# Patient Record
Sex: Male | Born: 1950 | Race: White | Hispanic: No | Marital: Married | Smoking: Former smoker
Health system: Southern US, Community
[De-identification: ages and names within clinical notes are randomized; demographics above are authoritative.]

## PROBLEM LIST (undated history)

## (undated) DIAGNOSIS — I10 Essential (primary) hypertension: Secondary | ICD-10-CM

## (undated) DIAGNOSIS — I6529 Occlusion and stenosis of unspecified carotid artery: Secondary | ICD-10-CM

## (undated) DIAGNOSIS — C801 Malignant (primary) neoplasm, unspecified: Secondary | ICD-10-CM

## (undated) DIAGNOSIS — Z940 Kidney transplant status: Secondary | ICD-10-CM

## (undated) HISTORY — PX: AV FISTULA PLACEMENT: SHX1204

## (undated) HISTORY — DX: Occlusion and stenosis of unspecified carotid artery: I65.29

## (undated) HISTORY — DX: Malignant (primary) neoplasm, unspecified: C80.1

## (undated) HISTORY — PX: ROTATOR CUFF REPAIR: SHX139

---

## 2009-10-15 ENCOUNTER — Inpatient Hospital Stay (HOSPITAL_COMMUNITY): Admission: EM | Admit: 2009-10-15 | Discharge: 2009-10-23 | Payer: Self-pay | Admitting: Internal Medicine

## 2009-10-16 ENCOUNTER — Ambulatory Visit: Payer: Self-pay | Admitting: Vascular Surgery

## 2009-10-20 ENCOUNTER — Encounter (INDEPENDENT_AMBULATORY_CARE_PROVIDER_SITE_OTHER): Payer: Self-pay | Admitting: Nephrology

## 2009-10-22 ENCOUNTER — Ambulatory Visit: Payer: Self-pay | Admitting: Surgery

## 2009-12-03 ENCOUNTER — Ambulatory Visit: Payer: Self-pay | Admitting: Vascular Surgery

## 2010-10-31 LAB — GLUCOSE, CAPILLARY: Glucose-Capillary: 114 mg/dL — ABNORMAL HIGH (ref 70–99)

## 2010-10-31 LAB — CBC
HCT: 26.5 % — ABNORMAL LOW (ref 39.0–52.0)
HCT: 27.1 % — ABNORMAL LOW (ref 39.0–52.0)
HCT: 28.4 % — ABNORMAL LOW (ref 39.0–52.0)
HCT: 29.5 % — ABNORMAL LOW (ref 39.0–52.0)
Hemoglobin: 9 g/dL — ABNORMAL LOW (ref 13.0–17.0)
MCHC: 33 g/dL (ref 30.0–36.0)
MCHC: 33.2 g/dL (ref 30.0–36.0)
MCHC: 34 g/dL (ref 30.0–36.0)
MCV: 92.2 fL (ref 78.0–100.0)
MCV: 92.6 fL (ref 78.0–100.0)
MCV: 93.6 fL (ref 78.0–100.0)
MCV: 93.8 fL (ref 78.0–100.0)
MCV: 94.2 fL (ref 78.0–100.0)
Platelets: 107 10*3/uL — ABNORMAL LOW (ref 150–400)
Platelets: 144 10*3/uL — ABNORMAL LOW (ref 150–400)
Platelets: 94 10*3/uL — ABNORMAL LOW (ref 150–400)
Platelets: 98 10*3/uL — ABNORMAL LOW (ref 150–400)
RBC: 3.14 MIL/uL — ABNORMAL LOW (ref 4.22–5.81)
RDW: 14.6 % (ref 11.5–15.5)
RDW: 14.8 % (ref 11.5–15.5)
RDW: 15 % (ref 11.5–15.5)
RDW: 15.2 % (ref 11.5–15.5)
WBC: 6.1 10*3/uL (ref 4.0–10.5)
WBC: 6.2 10*3/uL (ref 4.0–10.5)
WBC: 7 10*3/uL (ref 4.0–10.5)
WBC: 7.8 10*3/uL (ref 4.0–10.5)

## 2010-10-31 LAB — PROTEIN ELECTROPH W RFLX QUANT IMMUNOGLOBULINS
Alpha-1-Globulin: 6.2 % — ABNORMAL HIGH (ref 2.9–4.9)
Gamma Globulin: 11.3 % (ref 11.1–18.8)
M-Spike, %: NOT DETECTED g/dL
Total Protein ELP: 5.4 g/dL — ABNORMAL LOW (ref 6.0–8.3)

## 2010-10-31 LAB — BASIC METABOLIC PANEL
BUN: 40 mg/dL — ABNORMAL HIGH (ref 6–23)
BUN: 93 mg/dL — ABNORMAL HIGH (ref 6–23)
CO2: 16 mEq/L — ABNORMAL LOW (ref 19–32)
Calcium: 7.6 mg/dL — ABNORMAL LOW (ref 8.4–10.5)
Calcium: 9.7 mg/dL (ref 8.4–10.5)
Chloride: 111 mEq/L (ref 96–112)
Creatinine, Ser: 9.19 mg/dL — ABNORMAL HIGH (ref 0.4–1.5)
GFR calc Af Amer: 7 mL/min — ABNORMAL LOW (ref >60)
GFR calc non Af Amer: 6 mL/min — ABNORMAL LOW (ref >60)
GFR calc non Af Amer: 9 mL/min — ABNORMAL LOW (ref >60)
Glucose, Bld: 109 mg/dL — ABNORMAL HIGH (ref 70–99)
Glucose, Bld: 97 mg/dL (ref 70–99)
Potassium: 4.1 mEq/L (ref 3.5–5.1)
Potassium: 4.4 mEq/L (ref 3.5–5.1)

## 2010-10-31 LAB — TSH: TSH: 2.055 u[IU]/mL (ref 0.350–4.500)

## 2010-10-31 LAB — URINALYSIS, ROUTINE W REFLEX MICROSCOPIC
Nitrite: NEGATIVE
Specific Gravity, Urine: 1.014 (ref 1.005–1.030)
Urobilinogen, UA: 1 mg/dL (ref 0.0–1.0)

## 2010-10-31 LAB — IRON AND TIBC
Iron: 65 ug/dL (ref 42–135)
Saturation Ratios: 31 % (ref 20–55)
TIBC: 210 ug/dL — ABNORMAL LOW (ref 215–435)
UIBC: 145 ug/dL

## 2010-10-31 LAB — RENAL FUNCTION PANEL
Albumin: 3.2 g/dL — ABNORMAL LOW (ref 3.5–5.2)
BUN: 60 mg/dL — ABNORMAL HIGH (ref 6–23)
Calcium: 9.4 mg/dL (ref 8.4–10.5)
Chloride: 104 mEq/L (ref 96–112)
Creatinine, Ser: 6.29 mg/dL — ABNORMAL HIGH (ref 0.4–1.5)
Creatinine, Ser: 6.76 mg/dL — ABNORMAL HIGH (ref 0.4–1.5)
Creatinine, Ser: 7.43 mg/dL — ABNORMAL HIGH (ref 0.4–1.5)
GFR calc Af Amer: 10 mL/min — ABNORMAL LOW (ref >60)
GFR calc Af Amer: 11 mL/min — ABNORMAL LOW (ref >60)
GFR calc non Af Amer: 9 mL/min — ABNORMAL LOW (ref >60)
Glucose, Bld: 106 mg/dL — ABNORMAL HIGH (ref 70–99)
Glucose, Bld: 191 mg/dL — ABNORMAL HIGH (ref 70–99)
Phosphorus: 6.1 mg/dL — ABNORMAL HIGH (ref 2.3–4.6)
Phosphorus: 6.5 mg/dL — ABNORMAL HIGH (ref 2.3–4.6)
Potassium: 3.9 mEq/L (ref 3.5–5.1)
Potassium: 4.5 mEq/L (ref 3.5–5.1)

## 2010-10-31 LAB — UIFE/LIGHT CHAINS/TP QN, 24-HR UR
Beta, Urine: DETECTED — AB
Free Kappa/Lambda Ratio: 3.14 ratio (ref 0.46–4.00)
Free Lambda Lt Chains,Ur: 4.27 mg/dL — ABNORMAL HIGH (ref 0.08–1.01)
Time: 24 hours
Total Protein, Urine: 26.9 mg/dL

## 2010-10-31 LAB — COMPREHENSIVE METABOLIC PANEL
AST: 33 U/L (ref 0–37)
Albumin: 3.3 g/dL — ABNORMAL LOW (ref 3.5–5.2)
Alkaline Phosphatase: 80 U/L (ref 39–117)
BUN: 94 mg/dL — ABNORMAL HIGH (ref 6–23)
CO2: 17 mEq/L — ABNORMAL LOW (ref 19–32)
Calcium: 9 mg/dL (ref 8.4–10.5)
Chloride: 102 mEq/L (ref 96–112)
Chloride: 113 mEq/L — ABNORMAL HIGH (ref 96–112)
Creatinine, Ser: 6.93 mg/dL — ABNORMAL HIGH (ref 0.4–1.5)
GFR calc Af Amer: 10 mL/min — ABNORMAL LOW (ref >60)
GFR calc non Af Amer: 7 mL/min — ABNORMAL LOW (ref >60)
Potassium: 4.3 mEq/L (ref 3.5–5.1)
Sodium: 139 mEq/L (ref 135–145)
Total Bilirubin: 0.5 mg/dL (ref 0.3–1.2)
Total Protein: 5.7 g/dL — ABNORMAL LOW (ref 6.0–8.3)

## 2010-10-31 LAB — RETICULOCYTES
RBC.: 3.27 MIL/uL — ABNORMAL LOW (ref 4.22–5.81)
Retic Ct Pct: 0.5 % (ref 0.4–3.1)

## 2010-10-31 LAB — HEPARIN INDUCED THROMBOCYTOPENIA PNL
Heparin Induced Plt Ab: NEGATIVE
Patient O.D.: 0.288
Serotonin Release: NEGATIVE

## 2010-10-31 LAB — MAGNESIUM: Magnesium: 1.4 mg/dL — ABNORMAL LOW (ref 1.5–2.5)

## 2010-10-31 LAB — IGG, IGA, IGM: IgG (Immunoglobin G), Serum: 607 mg/dL — ABNORMAL LOW (ref 694–1618)

## 2010-10-31 LAB — URINE MICROSCOPIC-ADD ON

## 2010-10-31 LAB — DIFFERENTIAL
Lymphocytes Relative: 15 % (ref 12–46)
Lymphs Abs: 0.9 10*3/uL (ref 0.7–4.0)
Monocytes Relative: 5 % (ref 3–12)
Neutro Abs: 4.6 10*3/uL (ref 1.7–7.7)
Neutrophils Relative %: 78 % — ABNORMAL HIGH (ref 43–77)

## 2010-10-31 LAB — PTH, INTACT AND CALCIUM: Calcium, Total (PTH): 8.2 mg/dL — ABNORMAL LOW (ref 8.4–10.5)

## 2010-10-31 LAB — PHOSPHORUS: Phosphorus: 4.9 mg/dL — ABNORMAL HIGH (ref 2.3–4.6)

## 2010-10-31 LAB — PROTIME-INR: INR: 1.22 (ref 0.00–1.49)

## 2010-12-06 HISTORY — PX: KIDNEY TRANSPLANT: SHX239

## 2010-12-20 NOTE — Assessment & Plan Note (Signed)
OFFICE VISIT   Randall Archer, Randall Archer  DOB:  [birth year]                                       12/03/2009  CHART#:[id number]   Followup, left amino fistula.   HISTORY OF PRESENT ILLNESS:  Patient is a 60 year old gentleman who had  his left Cimino fistula placed by Dr. Scot Dock in March of this year.  He  dialyzes on Tuesday, Thursdays, and Saturdays through a palindrome  catheter.  He complains of no steal symptoms.  He has full use of his  hand.  He has no pain, numbness, or tingling in the hand.   PHYSICAL EXAMINATION:  Vital signs:  Heart rate 78, sats are 100%.  Respiratory rate is 10.  In the left upper extremity, the wounds are  healing well.  There is a 2+ radial pulse.  There is a good thrill and  bruit, and the vein is very prominent in the left upper extremity.   ASSESSMENT/PLAN:  Assessment is functioning arteriovenous fistula, which  is maturing well.  Plan is to have him follow up as needed.  If there  are any issues, the fistula may be used 3 months post surgery.   Wray Kearns, PA-C   Rosetta Posner, M.D.  Electronically Signed   RR/MEDQ  D:  12/03/2009  T:  12/03/2009  Job:  [id number]

## 2011-09-15 ENCOUNTER — Emergency Department (HOSPITAL_COMMUNITY): Payer: Medicare Other

## 2011-09-15 ENCOUNTER — Encounter (HOSPITAL_COMMUNITY): Payer: Self-pay | Admitting: Emergency Medicine

## 2011-09-15 ENCOUNTER — Emergency Department (HOSPITAL_COMMUNITY)
Admission: EM | Admit: 2011-09-15 | Discharge: 2011-09-16 | Disposition: A | Discharge status: Home / Self Care | Payer: Medicare Other | Attending: Emergency Medicine | Admitting: Emergency Medicine

## 2011-09-15 ENCOUNTER — Other Ambulatory Visit (INDEPENDENT_AMBULATORY_CARE_PROVIDER_SITE_OTHER): Payer: Medicare Other | Admitting: *Deleted

## 2011-09-15 DIAGNOSIS — Z940 Kidney transplant status: Secondary | ICD-10-CM | POA: Insufficient documentation

## 2011-09-15 DIAGNOSIS — R0989 Other specified symptoms and signs involving the circulatory and respiratory systems: Secondary | ICD-10-CM

## 2011-09-15 DIAGNOSIS — R911 Solitary pulmonary nodule: Secondary | ICD-10-CM

## 2011-09-15 DIAGNOSIS — I10 Essential (primary) hypertension: Secondary | ICD-10-CM | POA: Insufficient documentation

## 2011-09-15 DIAGNOSIS — Z7982 Long term (current) use of aspirin: Secondary | ICD-10-CM | POA: Insufficient documentation

## 2011-09-15 DIAGNOSIS — Z79899 Other long term (current) drug therapy: Secondary | ICD-10-CM | POA: Insufficient documentation

## 2011-09-15 DIAGNOSIS — I658 Occlusion and stenosis of other precerebral arteries: Secondary | ICD-10-CM | POA: Insufficient documentation

## 2011-09-15 DIAGNOSIS — I6529 Occlusion and stenosis of unspecified carotid artery: Secondary | ICD-10-CM

## 2011-09-15 HISTORY — DX: Kidney transplant status: Z94.0

## 2011-09-15 HISTORY — DX: Essential (primary) hypertension: I10

## 2011-09-15 LAB — BASIC METABOLIC PANEL
Calcium: 10.2 mg/dL (ref 8.4–10.5)
Creatinine, Ser: 1.08 mg/dL (ref 0.50–1.35)
GFR calc Af Amer: 84 mL/min — ABNORMAL LOW (ref >90)
GFR calc non Af Amer: 72 mL/min — ABNORMAL LOW (ref >90)

## 2011-09-15 LAB — CBC
MCV: 91.4 fL (ref 78.0–100.0)
Platelets: 144 10*3/uL — ABNORMAL LOW (ref 150–400)
RDW: 12.9 % (ref 11.5–15.5)
WBC: 3.3 10*3/uL — ABNORMAL LOW (ref 4.0–10.5)

## 2011-09-15 MED ORDER — IOHEXOL 350 MG/ML SOLN
70.0000 mL | Freq: Once | INTRAVENOUS | Status: AC | PRN
  Administered 2011-09-15: 70 mL via INTRAVENOUS

## 2011-09-15 MED ORDER — SODIUM CHLORIDE 0.9 % IV BOLUS (SEPSIS)
1000.0000 mL | Freq: Once | INTRAVENOUS | Status: AC
  Administered 2011-09-15: 1000 mL via INTRAVENOUS

## 2011-09-15 MED ORDER — IOHEXOL 350 MG/ML SOLN
50.0000 mL | Freq: Once | INTRAVENOUS | Status: AC | PRN
  Administered 2011-09-15: 50 mL via INTRAVENOUS

## 2011-09-15 MED ORDER — SODIUM CHLORIDE 0.9 % IV SOLN
Freq: Once | INTRAVENOUS | Status: AC
  Administered 2011-09-15: 22:00:00 via INTRAVENOUS

## 2011-09-15 NOTE — ED Notes (Signed)
Pt sts [facility] for eval for R common artery dissection; pt sts had ultrasound but needed to be cleared

## 2011-09-15 NOTE — ED Provider Notes (Signed)
History     CSN: [id number]  Arrival date & time 09/15/11  1615   First MD Initiated Contact with Patient 09/15/11 1703      Chief Complaint  Patient presents with  . Circulatory Problem    sent for eval for possible disection    (Consider location/radiation/quality/duration/timing/severity/associated sxs/prior treatment) HPI Comments: 61yo CM sent from [facility] for evaluation of possible innominate or Right common carotid artery dissection. Patient had ultrasound in clinic today concerning for possible dissection. He is asymptomatic.   Patient is a 61 y.o. male presenting with neurologic complaint. The history is provided by the patient.  Neurologic Problem Primary symptoms do not include headaches, syncope, loss of consciousness, altered mental status, seizures, dizziness, visual change, paresthesias, focal weakness, loss of sensation, speech change, memory loss, fever, nausea or vomiting. Episode onset: unknown. Progression since onset: asymptomatic. The neurological symptoms are focal. Context: No recent trauma.  Additional symptoms do not include neck stiffness, weakness, pain, leg pain, photophobia, hearing loss, tinnitus or vertigo. Medical issues also include hypertension. Medical issues do not include seizures or cerebral vascular accident. Associated medical issues comments: renal transplant. Workup history includes carotid ultrasound.    Past Medical History  Diagnosis Date  . Hx of kidney transplant   . Hypertension     History reviewed. No pertinent past surgical history.  History reviewed. No pertinent family history.  History  Substance Use Topics  . Smoking status: Never Smoker   . Smokeless tobacco: Not on file  . Alcohol Use: No      Review of Systems  Constitutional: Negative for fever, chills, activity change, appetite change and fatigue.  HENT: Negative for hearing loss, congestion, sore throat, rhinorrhea, neck pain, neck  stiffness and tinnitus.   Eyes: Negative for photophobia, redness and visual disturbance.  Respiratory: Negative for cough, shortness of breath and wheezing.   Cardiovascular: Negative for chest pain, palpitations, leg swelling and syncope.  Gastrointestinal: Negative for nausea, vomiting, abdominal pain, diarrhea, constipation and blood in stool.  Genitourinary: Negative for dysuria, urgency, hematuria and flank pain.  Musculoskeletal: Negative for back pain.  Skin: Negative for rash and wound.  Neurological: Negative for dizziness, vertigo, speech change, focal weakness, seizures, loss of consciousness, facial asymmetry, speech difficulty, weakness, light-headedness, numbness, headaches and paresthesias.  Psychiatric/Behavioral: Negative for memory loss, confusion and altered mental status.  All other systems reviewed and are negative.    Allergies  Review of patient's allergies indicates no known allergies.  Home Medications   Current Outpatient Rx  Name Route Sig Dispense Refill  . ALLOPURINOL 100 MG PO TABS Oral Take 100 mg by mouth 2 (two) times daily.    Marland Kitchen AMLODIPINE BESYLATE 5 MG PO TABS Oral Take 5 mg by mouth every evening.    . ASPIRIN EC 81 MG PO TBEC Oral Take 81 mg by mouth daily.    Marland Kitchen CLONAZEPAM 1 MG PO TABS Oral Take 1 mg by mouth every evening.    Marland Kitchen DOCUSATE SODIUM 100 MG PO CAPS Oral Take 100 mg by mouth every other day.    Marland Kitchen MYCOPHENOLATE SODIUM 180 MG PO TBEC Oral Take 360 mg by mouth 2 (two) times daily.    Marland Kitchen OMEPRAZOLE 20 MG PO CPDR Oral Take 20 mg by mouth every evening.    . K PHOS MONO-SOD PHOS DI & MONO 155-852-130 MG PO TABS Oral Take 2 tablets by mouth 2 (two) times daily.    . SODIUM BICARBONATE 650 MG  PO TABS Oral Take 1,300 mg by mouth 2 (two) times daily.    . SULFAMETHOXAZOLE-TRIMETHOPRIM 400-80 MG PO TABS Oral Take 1 tablet by mouth every Monday, Wednesday, and Friday.    Marland Kitchen TACROLIMUS 1 MG PO CAPS Oral Take 2 mg by mouth 2 (two) times daily.    Marland Kitchen  TAMSULOSIN HCL 0.4 MG PO CAPS Oral Take 0.4 mg by mouth daily after breakfast.    . VARDENAFIL HCL 20 MG PO TABS Oral Take 20 mg by mouth daily as needed. For E.D.      BP 150/88  Pulse 77  Temp(Src) 97.9 F (36.6 C) (Oral)  Resp 16  SpO2 97%  Physical Exam  Nursing note and vitals reviewed. Constitutional: He is oriented to person, place, and time. He appears well-developed and well-nourished.  Non-toxic appearance. No distress.  HENT:  Head: Normocephalic and atraumatic.  Mouth/Throat: Oropharynx is clear and moist.  Eyes: Conjunctivae and EOM are normal. Pupils are equal, round, and reactive to light. No scleral icterus.  Neck: Normal range of motion and full passive range of motion without pain. Neck supple. No JVD present.       + grade 2/5 carotid artery bruit on right side.   Cardiovascular: Normal rate, regular rhythm, normal heart sounds and intact distal pulses.   No murmur heard. Pulmonary/Chest: Effort normal and breath sounds normal. No respiratory distress. He has no wheezes. He has no rales.  Abdominal: Soft. Bowel sounds are normal. He exhibits no distension. There is no tenderness. There is no rebound and no guarding.  Musculoskeletal: Normal range of motion.  Neurological: He is alert and oriented to person, place, and time. He has normal strength. No cranial nerve deficit or sensory deficit. GCS eye subscore is 4. GCS verbal subscore is 5. GCS motor subscore is 6.  Skin: Skin is warm and dry. No rash noted. He is not diaphoretic.  Psychiatric: He has a normal mood and affect.    ED Course  Procedures (including critical care time)  Labs Reviewed  BASIC METABOLIC PANEL - Abnormal; Notable for the following:    Glucose, Bld 133 (*)    GFR calc non Af Amer 72 (*)    GFR calc Af Amer 84 (*)    All other components within normal limits  CBC - Abnormal; Notable for the following:    WBC 3.3 (*)    RBC 4.18 (*)    HCT 38.2 (*)    Platelets 144 (*)    All other  components within normal limits   Ct Angio Neck W/cm &/or Wo/cm  09/15/2011  *RADIOLOGY REPORT*  Clinical Data:  Question right carotid dissection on carotid Doppler performed today.  CT ANGIOGRAPHY NECK  Technique:  Multidetector CT imaging of the neck was performed using the standard protocol during bolus administration of intravenous contrast.  Multiplanar CT image reconstructions including MIPs were obtained to evaluate the vascular anatomy. Carotid stenosis measurements (when applicable) are obtained utilizing NASCET criteria, using the distal internal carotid diameter as the denominator.  Contrast: 40mL OMNIPAQUE IOHEXOL 350 MG/ML IV SOLN  Comparison:   None.  Findings:  Lung apices are clear.  Negative for mass or adenopathy in the neck.  Cervical disc degeneration and prominent spondylosis C5-6 and C6-7.  No acute bony abnormality.  Right carotid:  Atherosclerotic calcification in the innominate and right common carotid artery.  Negative for dissection or significant stenosis.  Atherosclerotic calcification in the carotid bulb on the right without significant stenosis.  Negative for dissection of the internal carotid artery.  Left carotid:  Atherosclerotic calcified plaque in the common carotid and in the carotid bulb without significant stenosis. Negative for carotid dissection or stenosis on the left.  Vertebral arteries:  Both vertebral arteries are patent.  Mild atherosclerotic calcification in the proximal right vertebral artery and the distal right vertebral artery.   Review of the MIP images confirms the above findings.  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 Negative for carotid dissection.  No significant carotid or vertebral artery stenosis.  Original Report Authenticated By: Truett Perna, M.D.   Ct Angio Chest W/cm &/or Wo Cm  09/15/2011  *RADIOLOGY REPORT*  Clinical Data: Question of right common carotid/innominate artery dissection on ultrasound.   Ultrasound is not available.  CT ANGIOGRAPHY CHEST  Technique:  Multidetector CT imaging of the chest using the standard protocol during bolus administration of intravenous contrast. Multiplanar reconstructed images including MIPs were obtained and reviewed to evaluate the vascular anatomy.  Contrast: 66mL OMNIPAQUE IOHEXOL 350 MG/ML IV SOLN  Comparison: None.  Findings: There is atherosclerotic calcification of the aorta and great vessels.  However, there is no evidence for dissection.  The heart size is normal.  The pulmonary arteries are also well opacified by the contrast bolus.  There is no evidence for acute pulmonary embolus.  Note is made of right hilar lymph node which measures 2.5 x 1.6 cm.  No pulmonary nodules, pleural effusions, or infiltrates.  There is mild, mosaic appearance of the lung parenchyma, consistent with mild edema. The visualized portion of the thyroid gland has a normal appearance.  In the anterior chest wall, there is a circumscribed, 3.5 x 2.0 cm subcutaneous mass.  This appears low attenuation and may represent an intradermal inclusion cyst or other benign lesion.  Correlation with physical exam is recommended.  Mass is superficial to the sternum and is likely visible on physical exam.  Visualized osseous structures have a normal appearance.  IMPRESSION:  1. 1.  No evidence for aortic or innominate artery dissection. 2.  Atherosclerotic calcification without aneurysm. 3.  Right hilar lymph node, nonspecific in appearance.  Correlation is recommended with previous exams if available.  Otherwise, I would suggest follow-up chest CT in 6 months if the patient has a history of smoking.  Otherwise, follow-up of the chest is suggested in 12 months if the patient has no history of surgery. 4.  Anterior chest wall lesion, felt to be benign.  Correlation with physical exam is recommended.  Original Report Authenticated By: Glenice Bow, M.D.     1. Right carotid bruit       MDM    61yo CM sent from [facility] for evaluation of possible innominate or Right common carotid artery dissection. Patient had ultrasound in clinic today concerning for possible dissection. He is asymptomatic. Pt had ultrasound due to his renal doctor finding a carotid bruit on physical exam. He is neurologically intact. Getting basic labs to eval renal function. Will contact [facility] to ensure correct study ordered.   Vascular surgeon contacted requesting CTA neck and chest.   Pt creatinine 1. Discussed with nephrologist Dr. Idolina Primer on call for [facility] who recs persuing CTA as requested by vasc surgeon. Knowing risk of potential nephrotoxicity to transplanted kidney. Pt has already received 1L saline bolus prior to CT. Renal recs additional bolus after CTA. Pt aware of risks and agrees to CTA.   At 9:45 PM discussed CT  results with Dr. Owens Shark. Radiologist having difficulty dictating at this time. CTA neck and chest without evidence of dissection or carotids or innominate artery or other great vessels. Small pulm nodule right perihilar region. Pt getting 2nd liter IVF.   Pt getting 3rd liter IVF 200cc/h.   At 11:54 PM Pt has had hydration s/p CT. CTs neg for dissection. Pt has athero and pulm nodule. Results reviewed with pt. Will d/c.   Merideth Abbey, MD 09/16/11 0030

## 2011-09-15 NOTE — ED Notes (Signed)
Sent here per [facility] for eval of carotid artery dissection. Sent to [facility] per dr [employer]. Pt denies symptoms.

## 2011-09-16 NOTE — ED Provider Notes (Signed)
I saw and evaluated the patient, reviewed the resident's note and I agree with the findings and plan.   .Face to face Exam:  General:  Awake HEENT:  Atraumatic Resp:  Normal effort Abd:  Nondistended Neuro:No focal weakness Lymph: No adenopathy   Dot Lanes, MD 09/16/11 [phone]

## 2011-09-25 NOTE — Procedures (Unsigned)
CAROTID DUPLEX EXAM  INDICATION:  Right carotid bruit.  HISTORY: Diabetes:  No. Cardiac:  No. Hypertension:  Yes. Smoking:  No. Previous Surgery:  No. CV History:  Currently asymptomatic. Amaurosis Fugax No, Paresthesias No, Hemiparesis No                                      RIGHT             LEFT Brachial systolic pressure:                           AVF Brachial Doppler waveforms: Vertebral direction of flow:        Antegrade         Antegrade DUPLEX VELOCITIES (cm/sec) CCA peak systolic                   P=276, M=71       71 ECA peak systolic                   61                93 ICA peak systolic                   66                59 ICA end diastolic                   27                23 PLAQUE MORPHOLOGY:                  Mixed             Mixed PLAQUE AMOUNT:                      Moderate          Minimal PLAQUE LOCATION:                    Proximal CCA, bifurcation, ICA      Bifurcation, ICA  IMPRESSION: 1. 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 2. Right proximal common carotid artery stenosis with elevated     velocity of 276 cm/s. 3. Bilateral internal carotid artery velocity suggestive of a 1-39%     stenosis. 4. Dr. Bridgett Larsson was notified of these findings, who then consulted with     the patient and advised them to go to the ER.  ___________________________________________ Randall Palmer, MD  EM/MEDQ  D:  09/15/2011  T:  09/15/2011  Job:  [id number]

## 2011-09-28 ENCOUNTER — Other Ambulatory Visit: Payer: Self-pay | Admitting: *Deleted

## 2011-09-28 ENCOUNTER — Telehealth: Payer: Self-pay | Admitting: *Deleted

## 2011-09-28 DIAGNOSIS — I6529 Occlusion and stenosis of unspecified carotid artery: Secondary | ICD-10-CM

## 2011-09-28 NOTE — Telephone Encounter (Signed)
Dr. Oneida Alar reviewed patients carotid duplex and CTA done thru the ED on 09-15-11. Patient will followup with Dr. Bridgett Larsson as a new patient in 1 year. Will have protocol carotid duplex at that time. Per Dr. Oneida Alar, there was no carotid dissection and only minimal carotid stenosis. I called Shaquina at Dr. Jason Nest office and left this message on her voicemail.

## 2012-02-02 DIAGNOSIS — D849 Immunodeficiency, unspecified: Secondary | ICD-10-CM | POA: Insufficient documentation

## 2012-02-02 DIAGNOSIS — I10 Essential (primary) hypertension: Secondary | ICD-10-CM | POA: Insufficient documentation

## 2012-09-12 ENCOUNTER — Encounter: Payer: Self-pay | Admitting: Vascular Surgery

## 2012-09-13 ENCOUNTER — Encounter: Payer: Self-pay | Admitting: Vascular Surgery

## 2012-09-13 ENCOUNTER — Other Ambulatory Visit (INDEPENDENT_AMBULATORY_CARE_PROVIDER_SITE_OTHER): Payer: Medicare Other | Admitting: *Deleted

## 2012-09-13 ENCOUNTER — Ambulatory Visit (INDEPENDENT_AMBULATORY_CARE_PROVIDER_SITE_OTHER): Payer: Medicare Other | Admitting: Vascular Surgery

## 2012-09-13 VITALS — BP 139/81 | HR 67 | Wt 177.9 lb

## 2012-09-13 DIAGNOSIS — I771 Stricture of artery: Secondary | ICD-10-CM

## 2012-09-13 DIAGNOSIS — I6529 Occlusion and stenosis of unspecified carotid artery: Secondary | ICD-10-CM | POA: Insufficient documentation

## 2012-09-13 NOTE — Progress Notes (Signed)
[facility] [place]  New Carotid Patient  Referred by:  Raina Mina, MD [place], [place] [address]  Reason for referral: prior abnormal carotid study  History of Present Illness  Randall Archer is a 62 y.o. ([birth year]) male who presents with chief complaint: annual follow-up.  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  Subsequent CTA neck demonstated no significant carotid stenosis and extensive calcification of innominate artery.  Patient has no history of TIA or stroke symptom.  The patient has nevern had amaurosis fugax or monocular blindness.  The patient has nevern had facial drooping or hemiplegia.  The patient has never had receptive or expressive aphasia.   The patient's risks factors for carotid disease include: immunosuppression (s/p LRKT), HTN, former smoker  Past Medical History  Diagnosis Date  . Hx of kidney transplant   . Hypertension     Past Surgical History  Procedure Date  . Kidney transplant 12/2010    History   Social History  . Marital Status: Married    Spouse Name: N/A    Number of Children: N/A  . Years of Education: N/A   Occupational History  . Not on file.   Social History Main Topics  . Smoking status: Former Smoker    Types: Cigarettes    Quit date: 09/13/2008  . Smokeless tobacco: Never Used  . Alcohol Use: No  . Drug Use: No  . Sexually Active: Not on file   Other Topics Concern  . Not on file   Social History Narrative  . No narrative on file    History reviewed. No pertinent family history.  Current Outpatient Prescriptions on File Prior to Visit  Medication Sig Dispense Refill  . allopurinol (ZYLOPRIM) 100 MG tablet Take 100 mg by mouth 2 (two) times daily.      Marland Kitchen amLODipine (NORVASC) 5 MG tablet Take 5 mg by mouth every evening.      Marland Kitchen aspirin EC 81 MG tablet Take 81 mg by mouth daily.      .  clonazePAM (KLONOPIN) 1 MG tablet Take 1 mg by mouth every evening.      . docusate sodium (COLACE) 100 MG capsule Take 100 mg by mouth every other day.      . mycophenolate (MYFORTIC) 180 MG EC tablet Take 360 mg by mouth 2 (two) times daily.      Marland Kitchen omeprazole (PRILOSEC) 20 MG capsule Take 20 mg by mouth every evening.      . phosphorus (PHOSPHA 250 NEUTRAL) 155-852-130 MG tablet Take 2 tablets by mouth 2 (two) times daily.      . sodium bicarbonate 650 MG tablet Take 1,300 mg by mouth 2 (two) times daily.      Marland Kitchen sulfamethoxazole-trimethoprim (BACTRIM,SEPTRA) 400-80 MG per tablet Take 1 tablet by mouth every Monday, Wednesday, and Friday.      . tacrolimus (PROGRAF) 1 MG capsule Take 2 mg by mouth 2 (two) times daily.      . Tamsulosin HCl (FLOMAX) 0.4 MG CAPS Take 0.4 mg by mouth daily after breakfast.      . vardenafil (LEVITRA) 20 MG tablet Take 20 mg by mouth daily as needed. For E.D.        No Known Allergies  REVIEW OF SYSTEMS:  (Positives checked otherwise negative)  CARDIOVASCULAR:  [ ]  chest pain, [ ]  chest pressure, [ ]  palpitations, [ ]  shortness of breath  when laying flat, [ ]  shortness of breath with exertion,   [ ]  pain in feet when walking, [ ]  pain in feet when laying flat, [ ]  history of blood clot in veins (DVT), [ ]  history of phlebitis, [ ]  swelling in legs, [ ]  varicose veins  PULMONARY:  [ ]  productive cough, [ ]  asthma, [ ]  wheezing  NEUROLOGIC:  [ ]  weakness in arms or legs, [ ]  numbness in arms or legs, [ ]  difficulty speaking or slurred speech, [ ]  temporary loss of vision in one eye, [ ]  dizziness  HEMATOLOGIC:  [ ]  bleeding problems, [ ]  problems with blood clotting too easily  MUSCULOSKEL:  [ ]  joint pain, [ ]  joint swelling  GASTROINTEST:  [ ]   Vomiting blood, [ ]   Blood in stool     GENITOURINARY:  [ ]   Burning with urination, [ ]   Blood in urine  PSYCHIATRIC:  [ ]  history of major depression  INTEGUMENTARY:  [ ]  rashes, [ ]  ulcers  CONSTITUTIONAL:  [  ] fever, [ ]  chills  Physical Examination  Filed Vitals:   09/13/12 1402  BP: 139/81  Pulse: 67  Weight: 177 lb 14.4 oz (80.695 kg)  SpO2: 100%   There is no height on file to calculate BMI.  General: A&O x 3, WDWN  Head: [place]/AT  Ear/Nose/Throat: Hearing grossly intact, nares w/o erythema or drainage, oropharynx w/o Erythema/Exudate  Eyes: PERRLA, EOMI  Neck: Supple, no nuchal rigidity, no palpable LAD  Pulmonary: Sym exp, good air movt, CTAB, no rales, rhonchi, & wheezing  Cardiac: RRR, Nl S1, S2, no Murmurs, rubs or gallops  Vascular: Vessel Right Left  Radial Palpable Palpable  Ulnar Palpable Palpable  Brachial Palpable Palpable  Carotid Palpable, with bruit Palpable, with bruit  Aorta Not palpable N/A  Femoral Palpable Palpable  Popliteal Not palpable Not palpable  PT Palpable Palpable  DP Palpable Palpable   Gastrointestinal: soft, NTND, -G/R, - HSM, - masses, - CVAT B  Musculoskeletal: M/S 5/5 throughout , Extremities without ischemic changes , palpable thrill in L forearm  Neurologic: CN 2-12 intact , Pain and light touch intact in extremities , Motor exam as listed above  Psychiatric: Judgment intact, Mood & affect appropriate for pt's clinical situation  Dermatologic: See M/S exam for extremity exam, no rashes otherwise noted  Lymph : No Cervical, Axillary, or Inguinal lymphadenopathy   Non-Invasive Vascular Imaging  CAROTID DUPLEX (Date: 09/13/12):   R ICA stenosis: 1-39%  R VA: patent and antegrade/atypical  L ICA stenosis: 1-39%  L VA: patent and antegrade  R proximal SCA: 235 c/s  R CCA: 263 c/s (276 c/s)  Medical Decision Making  Randall Archer is a 62 y.o. male who presents with: no hemodynamic B ICA stenosis, R CCA, SCA, and innominate stenosis   Based on the patient's vascular studies and examination, I have offered the patient: annual carotid duplex to monitor the R CCA.  Patient is completely asx in regards to the innominate,  subclavian, and common carotid disease with stable CCA velocities for now.  I don't think there is any immediate intervention needed.  I discussed in depth with the patient the nature of atherosclerosis, and emphasized the importance of maximal medical management including strict control of blood pressure, blood glucose, and lipid levels, obtaining regular exercise, antiplatelet agents, and cessation of smoking.    The patient is aware that without maximal medical management the underlying atherosclerotic disease process will progress, limiting the  benefit of any interventions.  Thank you for allowing [place] to participate in this patient's care.  Adele Barthel, MD [facility] [place] Office: [phone] Pager: [phone]  09/13/2012, 3:37 PM

## 2012-09-17 ENCOUNTER — Other Ambulatory Visit: Payer: Self-pay | Admitting: *Deleted

## 2012-09-17 DIAGNOSIS — I6529 Occlusion and stenosis of unspecified carotid artery: Secondary | ICD-10-CM

## 2012-10-05 DIAGNOSIS — C801 Malignant (primary) neoplasm, unspecified: Secondary | ICD-10-CM

## 2012-10-05 HISTORY — DX: Malignant (primary) neoplasm, unspecified: C80.1

## 2013-03-14 ENCOUNTER — Other Ambulatory Visit: Payer: Medicare Other

## 2013-03-14 ENCOUNTER — Ambulatory Visit: Payer: Medicare Other | Admitting: Vascular Surgery

## 2013-03-27 ENCOUNTER — Encounter: Payer: Self-pay | Admitting: Family

## 2013-03-28 ENCOUNTER — Other Ambulatory Visit (INDEPENDENT_AMBULATORY_CARE_PROVIDER_SITE_OTHER): Payer: Medicare Other | Admitting: *Deleted

## 2013-03-28 ENCOUNTER — Ambulatory Visit (INDEPENDENT_AMBULATORY_CARE_PROVIDER_SITE_OTHER): Payer: Medicare Other | Admitting: Family

## 2013-03-28 ENCOUNTER — Telehealth: Payer: Self-pay | Admitting: Vascular Surgery

## 2013-03-28 ENCOUNTER — Encounter: Payer: Self-pay | Admitting: Family

## 2013-03-28 DIAGNOSIS — I6529 Occlusion and stenosis of unspecified carotid artery: Secondary | ICD-10-CM

## 2013-03-28 DIAGNOSIS — Z01810 Encounter for preprocedural cardiovascular examination: Secondary | ICD-10-CM

## 2013-03-28 DIAGNOSIS — I771 Stricture of artery: Secondary | ICD-10-CM

## 2013-03-28 NOTE — Progress Notes (Signed)
Established Carotid Patient  History of Present Illness  Randall Archer is a 62 y.o. male who has known carotid stenosis. 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 Subsequent CTA neck demonstated no significant carotid stenosis and extensive calcification of innominate artery. Patient has no history of TIA or stroke symptom. The patient has nevern had amaurosis fugax or monocular blindness. The patient has nevern had facial drooping or hemiplegia. The patient has never had receptive or expressive aphasia. The patient's risks factors for carotid disease include: immunosuppression (s/p LRKT), HTN, former smoker.   Patient has not had previous Neither CEA.  Patient has Negative history of TIA or stroke symptom.  The patient denies amaurosis fugax or monocular blindness.  The patient  denies facial drooping.  Pt. denies hemiplegia.  The patient denies receptive or expressive aphasia.  Pt. denies weakness.  New Medical or Surgical History other than a skin lesion removed on left hand by dermatologist, not cancer per patient.  Pt Diabetic: No Pt smoker: No  Pt meds include: Statin : Yes ASA: Yes Other anticoagulants/antiplatelets: none   Past Medical History  Diagnosis Date  . Hx of kidney transplant   . Hypertension   . 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 Carotid artery occlusion     Social History History  Substance Use Topics  . Smoking status: Former Smoker    Types: Cigarettes    Quit date: 09/13/2008  . Smokeless tobacco: Never Used  . Alcohol Use: No    Family History Family History  Problem Relation Age of Onset  . Hypertension Mother   . Hypertension Father   . Cancer Father   . Heart disease Brother     Heart Disease before age 40  . Heart attack Brother     Surgical History Past Surgical History  Procedure Laterality Date  . Kidney transplant  12/2010    No  Known Allergies  Current Outpatient Prescriptions  Medication Sig Dispense Refill  . allopurinol (ZYLOPRIM) 100 MG tablet Take 100 mg by mouth 2 (two) times daily.      Marland Kitchen amLODipine (NORVASC) 5 MG tablet Take 5 mg by mouth every evening.      Marland Kitchen aspirin EC 81 MG tablet Take 81 mg by mouth daily.      Marland Kitchen BOOSTRIX 5-2.5-18.5 injection       . clonazePAM (KLONOPIN) 1 MG tablet Take 1 mg by mouth every evening.      . docusate sodium (COLACE) 100 MG capsule Take 100 mg by mouth every other day.      . mycophenolate (MYFORTIC) 180 MG EC tablet Take 360 mg by mouth 2 (two) times daily.      Marland Kitchen omeprazole (PRILOSEC) 20 MG capsule Take 20 mg by mouth every evening.      . phosphorus (PHOSPHA 250 NEUTRAL) 155-852-130 MG tablet Take 2 tablets by mouth 2 (two) times daily.      . pravastatin (PRAVACHOL) 20 MG tablet       . sodium bicarbonate 650 MG tablet Take 1,300 mg by mouth 2 (two) times daily.      Marland Kitchen sulfamethoxazole-trimethoprim (BACTRIM,SEPTRA) 400-80 MG per tablet Take 1 tablet by mouth every Monday, Wednesday, and Friday.      . tacrolimus (PROGRAF) 1 MG capsule Take 2 mg by mouth 2 (two) times daily.      . Tamsulosin HCl (FLOMAX) 0.4 MG CAPS Take 0.4 mg  by mouth daily after breakfast.      . vardenafil (LEVITRA) 20 MG tablet Take 20 mg by mouth daily as needed. For E.D.       No current facility-administered medications for this visit.    Review of Systems : [x]  Positive   [ ]  Denies  General:[ ]  Weight loss,  [ ]  Weight gain, [ ]  Loss of appetite, [ ]  Fever, [ ]  chills  Neurologic: [ ]  Dizziness, [ ]  Blackouts, [ ]  Headaches, [ ]  Seizure [ ]  Stroke, [ ]  "Mini stroke", [ ]  Slurred speech, [ ]  Temporary blindness;  [ ] weakness,  Ear/Nose/Throat: [ ]  Change in hearing, [ ]  Nose bleeds, [ ]  Hoarseness  Vascular:[ ]  Pain in legs with walking, [ ]  Pain in feet while lying flat , [ ]   Non-healing ulcer, [ ]  Blood clot in vein,    Pulmonary: [ ]  Home oxygen, [ ]   Productive cough, [ ]   Bronchitis, [ ]  Coughing up blood,  [ ]  Asthma, [ ]  Wheezing  Musculoskeletal:  [ ]  Arthritis, [ ]  Joint pain, [ ]  low back pain  Cardiac: [ ]  Chest pain, [ ]  Shortness of breath when lying flat, [ ]  Shortness of breath with exertion, [ ]  Palpitations, [ ]  Heart murmur, [ ]   Atrial fibrillation  Hematologic:[X ] Easy Bruising, [ ]  Anemia; [ ]  Hepatitis  Psychiatric: [ ]   Depression, [ ]  Anxiety   Gastrointestinal: [ ]  Black stool, [ ]  Blood in stool, [ ]  Peptic ulcer disease,  [ ]  Gastroesophageal Reflux, [ ]  Trouble swallowing, [ ]  Diarrhea, [ ]  Constipation  Urinary: [ ]  chronic Kidney disease, [ ]  on HD, [ ]  Burning with urination, [ ]  Frequent urination, [ ]  Difficulty urinating; POSITIVE FOR S/P KIDNEY TRANSPLANT, MAY, 2012; prior to that was on hemodialysis for 13 years.  Skin: [ ]  Rashes, [ ]  Wounds    Physical Examination  Filed Vitals:   03/28/13 1004  BP:   Pulse:   Temp: 97.3 F (36.3 C)  Resp:     General: WDWN male in NAD GAIT: normal Eyes: PERRLA Pulmonary:  CTAB, Negative  Rales, Negative rhonchi, & Negative wheezing, positive for diminished air movement right lower posterior fields.  Cardiac: regular Rhythm ,  Negative Murmurs,   Vascular:      Vessel Right Left  Radial Palpable Palpable  Brachial Palpable Palpable  Carotid audible, without bruit audiable, without bruit  Aorta Not palpable N/A  Popliteal not palpable Not palpable  PT palpable palpable  DP Palpable Palpable    Gastrointestinal: soft, nontender, BS WNL, no r/g,  negative masses; note right flank well healed surgical scar.  Musculoskeletal: Negative muscle atrophy/wasting. M/S 5/5 throughout, Extremities without ischemic changes.  Neurologic: A&O X 3; Appropriate Affect ; SENSATION ;normal; MOTOR FUNCTION: normal 5/5 strength in all tested muscle groups Speech is normal. CN 2-12 intact, Pain and light touch intact in extremities, Motor exam as listed above.   Non-Invasive Vascular  Imaging CAROTID DUPLEX 03/28/2013   Right ICA <40% stenosis Left ICA <40% stenosis R CCA stenosis 288/28 c/s  These findings are Worse from previous exam   Assessment: Randall Archer is a 62 y.o. 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  Repeat CTA Neck was recommended to the patient to reinterrogate the innominate artery given worsening in velocities.  The patient will  follow up Dr. Bridgett Larsson in 2 weeks with that study.  I discussed in depth with the patient the nature of atherosclerosis, and emphasized the importance of maximal medical management including strict control of blood pressure, blood glucose, and lipid levels, obtaining regular exercise, and cessation of smoking.  The patient is aware that without maximal medical management the underlying atherosclerotic disease process will progress, limiting the benefit of any interventions.  Pt was given information regarding stroke symptoms and prevention.  Thank you for allowing [place] to participate in this patient's care.  Clemon Chambers, RN, MSN, FNP-C [facility] [place] Office: [phone]  Clinic Physician: Bridgett Larsson  03/28/2013 10:03 AM

## 2013-03-28 NOTE — Telephone Encounter (Signed)
Dr Bridgett Larsson has ordered a CTA Neck to evaluate Inominate Artery Stenosis.  Patient was concerned about having IV contrast due to having a kidney transplant. According to his wife, he requires IV fluids before and after having IV contrast.  I spoke with [place] at [facility]. She, in turn, talked with Dr Justin Mend. Per Dr Justin Mend- we may proceed as normal. There is no special requirements now that the patient is s/p transplant. IV fluids are no longer needed prior to contrast.  I advised this to the patient, but also encouraged that he call Dr Justin Mend if he was at all uneasy about our conversation. dpm

## 2013-04-10 ENCOUNTER — Encounter: Payer: Self-pay | Admitting: Vascular Surgery

## 2013-04-11 ENCOUNTER — Ambulatory Visit: Payer: Medicare Other | Admitting: Vascular Surgery

## 2013-04-11 ENCOUNTER — Ambulatory Visit
Admission: RE | Admit: 2013-04-11 | Discharge: 2013-04-11 | Disposition: A | Discharge status: Home / Self Care | Payer: Medicare Other | Source: Ambulatory Visit | Attending: Vascular Surgery | Admitting: Vascular Surgery

## 2013-04-11 DIAGNOSIS — I771 Stricture of artery: Secondary | ICD-10-CM

## 2013-04-11 DIAGNOSIS — Z01810 Encounter for preprocedural cardiovascular examination: Secondary | ICD-10-CM

## 2013-04-11 MED ORDER — IOHEXOL 350 MG/ML SOLN
50.0000 mL | Freq: Once | INTRAVENOUS | Status: AC | PRN
  Administered 2013-04-11: 50 mL via INTRAVENOUS

## 2013-04-18 ENCOUNTER — Encounter: Payer: Self-pay | Admitting: Vascular Surgery

## 2013-04-18 ENCOUNTER — Ambulatory Visit (INDEPENDENT_AMBULATORY_CARE_PROVIDER_SITE_OTHER): Payer: Medicare Other | Admitting: Vascular Surgery

## 2013-04-18 DIAGNOSIS — I771 Stricture of artery: Secondary | ICD-10-CM

## 2013-04-18 DIAGNOSIS — I6529 Occlusion and stenosis of unspecified carotid artery: Secondary | ICD-10-CM

## 2013-04-18 NOTE — Progress Notes (Signed)
[facility] [place]  Established Carotid Patient  History of Present Illness  Randall Archer is a 62 y.o. (04-06-1951) male who presents with chief complaint: follow-up on CTA.  Previous carotid studies demonstrated: RICA A999333 stenosis, LICA A999333 stenosis in setting of known innominate artery stenosis.  Patient has no history of TIA or stroke symptom.  The patient has never had amaurosis fugax or monocular blindness.  The patient has never had facial drooping or hemiplegia.  The patient has never had receptive or expressive aphasia.    The patient's PMH, PSH, SH, FamHx, Med, and Allergies are unchanged from 03/28/13.  On ROS today: no complications from LRKT, no CVA or TIA sx  Physical Examination  Filed Vitals:   04/18/13 1116  BP: 155/79  Pulse: 66  Height: 5\' 11"  (1.803 m)  Weight: 179 lb 11.2 oz (81.511 kg)  SpO2: 100%   Body mass index is 25.07 kg/(m^2).  General: A&O x 3, WD, WN  Eyes: PERRLA, EOMI  Neck: Supple, no nuchal rigidity, no palpable LAD  Pulmonary: Sym exp, good air movt, CTAB, no rales, rhonchi, & wheezing  Cardiac: RRR, Nl S1, S2, no Murmurs, rubs or gallops  Vascular: Vessel Right Left  Radial Palpable Palpable  Brachial Palpable Palpable  Carotid Palpable, without bruit Palpable, without bruit  Aorta Not palpable N/A  Femoral Palpable Palpable  Popliteal Not palpable Not palpable  PT Palpable Palpable  DP Palpable Palpable   Gastrointestinal: soft, NTND, -G/R, - HSM, - masses, - CVAT B  Musculoskeletal: M/S 5/5 throughout , Extremities without ischemic changes   Neurologic: CN 2-12 intact , Pain and light touch intact in extremities , Motor exam as listed above  CTA Neck (04/11/13)   No apparent change since the previous study.   Atherosclerotic calcification of the aortic arch and the brachiocephalic vessel origins from the arch.   Extensive atherosclerotic calcification of the innominate artery origin results in  luminal narrowing to about 5 mm. This probably represents a 50-70% stenosis.   Detail is limited because of low contrast opacity and physiologic motion in the chest.  Atherosclerotic calcification of both carotid bifurcation regions but without measurable stenosis relative to the diameter is of the more distal cervical internal carotid arteries.   Estimated 50% percent stenosis of the right vertebral artery origin and of the right vertebral artery at the foramen magnum level.   Medical Decision Making  Dracen Fluharty Trayer is a 62 y.o. 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  If CCA PSV increases or drops significantly, this would be indicative of possible disease progression.  I discussed in depth with the patient the nature of atherosclerosis, and emphasized the importance of maximal medical management including strict control of blood pressure, blood glucose, and lipid levels, antiplatelet agents, obtaining regular exercise, and cessation of smoking.    The patient is aware that without maximal medical management the underlying atherosclerotic disease process will progress, limiting the benefit of any interventions.  I discussed with the patient the accelerated atherosclerosis observed in transplant patient and the need to tightly manage his underlying disease processes to avoid progression of innominate disease. The patient is currently on a statin: Pravastatin. The patient is currently on an anti-platelet: ASA.  Thank you for allowing [place] to participate in this patient's  care.  Adele Barthel, MD [facility] [place] Office: [phone] Pager: [phone]  04/18/2013, 5:29 PM

## 2013-05-02 ENCOUNTER — Ambulatory Visit: Payer: Medicare Other | Admitting: Vascular Surgery

## 2013-09-08 ENCOUNTER — Other Ambulatory Visit: Payer: Self-pay | Admitting: Vascular Surgery

## 2013-09-08 DIAGNOSIS — I6529 Occlusion and stenosis of unspecified carotid artery: Secondary | ICD-10-CM

## 2013-10-16 ENCOUNTER — Encounter: Payer: Self-pay | Admitting: Vascular Surgery

## 2013-10-17 ENCOUNTER — Ambulatory Visit (HOSPITAL_COMMUNITY)
Admission: RE | Admit: 2013-10-17 | Discharge: 2013-10-17 | Disposition: A | Discharge status: Home / Self Care | Payer: Medicare Other | Source: Ambulatory Visit | Attending: Vascular Surgery | Admitting: Vascular Surgery

## 2013-10-17 ENCOUNTER — Encounter: Payer: Self-pay | Admitting: Vascular Surgery

## 2013-10-17 ENCOUNTER — Ambulatory Visit (INDEPENDENT_AMBULATORY_CARE_PROVIDER_SITE_OTHER): Payer: Medicare Other | Admitting: Vascular Surgery

## 2013-10-17 VITALS — BP 160/76 | HR 60 | Ht 71.0 in | Wt 180.5 lb

## 2013-10-17 DIAGNOSIS — I771 Stricture of artery: Secondary | ICD-10-CM

## 2013-10-17 DIAGNOSIS — I6529 Occlusion and stenosis of unspecified carotid artery: Secondary | ICD-10-CM | POA: Insufficient documentation

## 2013-10-17 NOTE — Progress Notes (Signed)
    Established Carotid Patient   History of Present Illness  Randall Archer is a 63 y.o. male  (12/19/1950) who presents with chief complaint: carotid follow-up. Previous carotid studies demonstrated: RICA A999333 stenosis, LICA A999333 stenosis in setting of known innominate artery stenosis. Patient has no history of TIA or stroke symptom. The patient has never had amaurosis fugax or monocular blindness. The patient has never had facial drooping or hemiplegia. The patient has never had receptive or expressive aphasia.   The patient's PMH, PSH, SH, FamHx, Med, and Allergies are unchanged from 04/18/14.   On ROS today: no complications from LRKT, no CVA or TIA sx   Physical Examination  Filed Vitals:   10/17/13 1022  BP: 160/76  Pulse: 60  Height: 5\' 11"  (1.803 m)  Weight: 180 lb 8 oz (81.874 kg)  SpO2: 100%   Body mass index is 25.19 kg/(m^2).  General: A&O x 3, WDWN   Eyes: PERRLA, EOMI   Neck: Supple, no nuchal rigidity, no palpable LAD   Pulmonary: Sym exp, good air movt, CTAB, no rales, rhonchi, & wheezing   Cardiac: RRR, Nl S1, S2, no Murmurs, rubs or gallops   Vascular:  Vessel  Right  Left   Radial  Palpable  Palpable   Brachial  Palpable  Palpable   Carotid  Palpable, without bruit  Palpable, without bruit   Aorta  Not palpable  N/A   Femoral  Palpable  Palpable   Popliteal  Not palpable  Not palpable   PT  Palpable  Palpable   DP  Palpable  Palpable    Gastrointestinal: soft, NTND, -G/R, - HSM, - masses, - CVAT B   Musculoskeletal: M/S 5/5 throughout , Extremities without ischemic changes   Neurologic: CN 2-12 intact , Pain and light touch intact in extremities , Motor exam as listed above   Non-Invasive Vascular Imaging  Carotid Duplex (Date: 10/17/2013):   R CCA stenosis > 50% (211/18 c/s, previous 288/28 c/s)  R ICA stenosis: <40%  R VA: patent with to-fro flow  L ICA stenosis: <40%  L VA: patent and antegrade  Medical Decision Making  Randall Archer is a 63 y.o. 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 If CCA PSV increases or drops significantly, this would be indicative of possible disease progression.  I discussed in depth with the patient the nature of atherosclerosis, and emphasized the importance of maximal medical management including strict control of blood pressure, blood glucose, and lipid levels, antiplatelet agents, obtaining regular exercise, and cessation of smoking.  The patient is aware that without maximal medical management the underlying atherosclerotic disease process will progress, limiting the benefit of any interventions.  I discussed with the patient the accelerated atherosclerosis observed in transplant patient and the need to tightly manage his underlying disease processes to avoid progression of innominate disease. The patient is currently on a statin: Pravastatin.  The patient is currently on an anti-platelet: ASA. Thank you for allowing [place] to participate in this patient's care.   Adele Barthel, MD [facility] [place] Office: [phone] Pager: [phone]  10/17/2013, 5:38 PM

## 2013-10-20 NOTE — Addendum Note (Signed)
Addended by: Mena Goes on: 10/20/2013 04:28 PM   Modules accepted: Orders

## 2014-04-23 ENCOUNTER — Encounter: Payer: Self-pay | Admitting: Vascular Surgery

## 2014-04-24 ENCOUNTER — Ambulatory Visit (INDEPENDENT_AMBULATORY_CARE_PROVIDER_SITE_OTHER): Payer: Medicare Other | Admitting: Vascular Surgery

## 2014-04-24 ENCOUNTER — Encounter: Payer: Self-pay | Admitting: Vascular Surgery

## 2014-04-24 ENCOUNTER — Ambulatory Visit (HOSPITAL_COMMUNITY)
Admission: RE | Admit: 2014-04-24 | Discharge: 2014-04-24 | Disposition: A | Discharge status: Home / Self Care | Payer: Medicare Other | Source: Ambulatory Visit | Attending: Vascular Surgery | Admitting: Vascular Surgery

## 2014-04-24 VITALS — BP 152/79 | HR 58 | Temp 97.2°F | Resp 16 | Ht 71.0 in | Wt 182.0 lb

## 2014-04-24 DIAGNOSIS — I6529 Occlusion and stenosis of unspecified carotid artery: Secondary | ICD-10-CM | POA: Diagnosis not present

## 2014-04-24 DIAGNOSIS — Z7982 Long term (current) use of aspirin: Secondary | ICD-10-CM | POA: Insufficient documentation

## 2014-04-24 DIAGNOSIS — I771 Stricture of artery: Secondary | ICD-10-CM

## 2014-04-24 NOTE — Addendum Note (Signed)
Addended by: Mena Goes on: 04/24/2014 11:02 AM   Modules accepted: Orders

## 2014-04-24 NOTE — Progress Notes (Signed)
   Established Carotid Patient  History of Present Illness  Randall Archer is a 63 y.o. male  (1951-08-06) s/p LRKT who presents with chief complaint: carotid follow-up. Previous carotid studies demonstrated: RICA A999333 stenosis, LICA A999333 stenosis in setting of known innominate artery stenosis. Patient has no history of TIA or stroke symptom. The patient has never had amaurosis fugax or monocular blindness. The patient has never had facial drooping or hemiplegia. The patient has never had receptive or expressive aphasia.   The patient's PMH, PSH, SH, FamHx, Med, and Allergies are unchanged from 10/17/13  On ROS today: no TIA or CVA, no PAD sx  Physical Examination  . Filed Vitals:   04/24/14 1021  BP: 152/79  Pulse: 58  Temp: 97.2 F (36.2 C)  TempSrc: Oral  Resp: 16  Height: 5\' 11"  (1.803 m)  Weight: 182 lb (82.555 kg)  SpO2: 99%    Body mass index is 25.4 kg/(m^2).   General: A&O x 3, WDWN   Eyes: PERRLA, EOMI   Neck: Supple, no nuchal rigidity, no palpable LAD   Pulmonary: Sym exp, good air movt, CTAB, no rales, rhonchi, & wheezing   Cardiac: RRR, Nl S1, S2, no Murmurs, rubs or gallops   Vascular:  Vessel  Right  Left   Radial  Palpable  Palpable   Brachial  Palpable  Palpable   Carotid  Palpable, with bruit  Palpable, without bruit   Aorta  Not palpable  N/A   Femoral  Palpable  Palpable   Popliteal  Not palpable  Not palpable   PT  Palpable  Palpable   DP  Palpable  Palpable    Gastrointestinal: soft, NTND, -G/R, - HSM, - masses, - CVAT B   Musculoskeletal: M/S 5/5 throughout , Extremities without ischemic changes   Neurologic: CN 2-12 intact , Pain and light touch intact in extremities , Motor exam as listed above   Non-Invasive Vascular Imaging  Carotid Duplex (Date: 04/24/2014):  R CCA stenosis > 50% (322/11 c/s <-211/18 c/s <- 288/28 c/s)  R ICA stenosis: <40%  R VA: patent with to-fro flow  L ICA stenosis: <40%  L VA: patent and antegrade  Medical  Decision Making  LEMARCUS Archer is a 63 y.o. male  who presents with: asx <50% stenoses B ICA stenosis , R innominate artery stenosis   Based on the patient's vascular studies and examination, I have offered the patient: q6 month carotid duplex. The CCA PSV isn't much different from 288 c/s previously, if PSV starts to escalate into high 300s, I would repeat the CTA. I discussed in depth with the patient the nature of atherosclerosis, and emphasized the importance of maximal medical management including strict control of blood pressure, blood glucose, and lipid levels, antiplatelet agents, obtaining regular exercise, and cessation of smoking.  The patient is aware that without maximal medical management the underlying atherosclerotic disease process will progress, limiting the benefit of any interventions.  I discussed with the patient the accelerated atherosclerosis observed in transplant patient and the need to tightly manage his underlying disease processes to avoid progression of innominate disease.  The patient is currently on a statin: Pravastatin.  The patient is currently on an anti-platelet: ASA.  Thank you for allowing [place] to participate in this patient's care.  Adele Barthel, MD [facility] [place] Office: [phone] Pager: [phone]  04/24/2014, 10:57 AM

## 2014-10-23 ENCOUNTER — Ambulatory Visit: Payer: Medicare Other | Admitting: Vascular Surgery

## 2014-10-23 ENCOUNTER — Other Ambulatory Visit (HOSPITAL_COMMUNITY): Payer: Medicare Other

## 2014-11-20 ENCOUNTER — Other Ambulatory Visit (HOSPITAL_COMMUNITY): Payer: Medicare Other

## 2014-11-20 ENCOUNTER — Ambulatory Visit: Payer: Medicare Other | Admitting: Vascular Surgery

## 2014-11-26 ENCOUNTER — Encounter: Payer: Self-pay | Admitting: Vascular Surgery

## 2014-11-27 ENCOUNTER — Ambulatory Visit (INDEPENDENT_AMBULATORY_CARE_PROVIDER_SITE_OTHER): Payer: Medicare Other | Admitting: Vascular Surgery

## 2014-11-27 ENCOUNTER — Encounter: Payer: Self-pay | Admitting: Vascular Surgery

## 2014-11-27 ENCOUNTER — Ambulatory Visit (HOSPITAL_COMMUNITY)
Admission: RE | Admit: 2014-11-27 | Discharge: 2014-11-27 | Disposition: A | Discharge status: Home / Self Care | Payer: Medicare Other | Source: Ambulatory Visit | Attending: Vascular Surgery | Admitting: Vascular Surgery

## 2014-11-27 VITALS — BP 135/70 | HR 54 | Ht 71.0 in | Wt 164.1 lb

## 2014-11-27 DIAGNOSIS — I771 Stricture of artery: Secondary | ICD-10-CM

## 2014-11-27 DIAGNOSIS — I6523 Occlusion and stenosis of bilateral carotid arteries: Secondary | ICD-10-CM

## 2014-11-27 NOTE — Progress Notes (Signed)
    Established Carotid Patient  History of Present Illness  Randall Archer is a 64 y.o. (11-02-1950) male who presents with chief complaint: no complaints.  This patient has known R SCA and innominate artery stenoses.  Previous carotid studies demonstrated: RICA A999333 stenosis, LICA A999333 stenosis.  Patient has no history of TIA or stroke symptom.  The patient has never had amaurosis fugax or monocular blindness.  The patient has never had facial drooping or hemiplegia.  The patient has never had receptive or expressive aphasia.  The patient denies any R arm claudication equivalent sx or vertebrobasilar sx  The patient's PMH, PSH, SH, FamHx, Med, and Allergies are unchanged from 04/24/14.  On ROS today: no dizziness, no arm pain or weakness  Physical Examination  Filed Vitals:   11/27/14 0953  BP: 135/70  Pulse: 54  Height: 5\' 11"  (1.803 m)  Weight: 164 lb 1.6 oz (74.435 kg)  SpO2: 100%   Body mass index is 22.9 kg/(m^2).  General: A&O x 3, WD, thin  Eyes: PERRLA, EOMI  Neck: Supple, no nuchal rigidity, no palpable LAD  Pulmonary: Sym exp, good air movt, CTAB, no rales, rhonchi, & wheezing  Cardiac: RRR, Nl S1, S2, no Murmurs, rubs or gallops  Vascular: Vessel Right Left  Radial Palpable Palpable  Brachial Palpable Palpable  Carotid Palpable, with bruit (R>L) Palpable, with bruit  Aorta Not palpable N/A  Femoral Palpable Palpable  Popliteal Not palpable Not palpable  PT Palpable Palpable  DP Palpable Palpable   Gastrointestinal: soft, NTND, -G/R, - HSM, - masses, - CVAT B  Musculoskeletal: M/S 5/5 throughout , Extremities without ischemic changes , strong L RC AVF fistula thrill  Neurologic: CN 2-12 intact , Pain and light touch intact in extremities , Motor exam as listed above  Non-Invasive Vascular Imaging  CAROTID DUPLEX (Date: 11/27/2014 ):   R ICA stenosis: <40%  R VA: patent and bidirectional  L ICA stenosis: <40%  L VA: patent and antegrade  R CCA:  297 c/s (322 c/s)  Medical Decision Making  Randall Archer is a 64 y.o. male who presents with: asx BICA stenosis <40%, asx R innominate and SCA stenoses, asx mild R subclavian steal    Pt is completely asx, so I doubt at advantage to aggressive mgmt of innominate stenosis or R SCA stenosis  Based on the patient's vascular studies and examination, I have offered the patient: annual B carotid duplex.  I discussed in depth with the patient the nature of atherosclerosis, and emphasized the importance of maximal medical management including strict control of blood pressure, blood glucose, and lipid levels, antiplatelet agents, obtaining regular exercise, and cessation of smoking.    The patient is aware that without maximal medical management the underlying atherosclerotic disease process will progress, limiting the benefit of any interventions. The patient is currently on a statin: Pravachol. The patient is currently on an anti-platelet: ASA.  Thank you for allowing [place] to participate in this patient's care.  Adele Barthel, MD [facility] [place] Office: [phone] Pager: [phone]  11/27/2014, 10:10 AM

## 2014-12-01 NOTE — Addendum Note (Signed)
Addended by: Mena Goes on: 12/01/2014 03:59 PM   Modules accepted: Orders

## 2015-10-31 DIAGNOSIS — N186 End stage renal disease: Secondary | ICD-10-CM | POA: Insufficient documentation

## 2015-10-31 DIAGNOSIS — E213 Hyperparathyroidism, unspecified: Secondary | ICD-10-CM | POA: Insufficient documentation

## 2015-10-31 DIAGNOSIS — Z992 Dependence on renal dialysis: Secondary | ICD-10-CM | POA: Insufficient documentation

## 2015-10-31 DIAGNOSIS — Z79899 Other long term (current) drug therapy: Secondary | ICD-10-CM | POA: Insufficient documentation

## 2015-10-31 DIAGNOSIS — M109 Gout, unspecified: Secondary | ICD-10-CM | POA: Insufficient documentation

## 2015-10-31 DIAGNOSIS — G4700 Insomnia, unspecified: Secondary | ICD-10-CM | POA: Insufficient documentation

## 2015-11-02 DIAGNOSIS — K219 Gastro-esophageal reflux disease without esophagitis: Secondary | ICD-10-CM | POA: Insufficient documentation

## 2015-11-04 DIAGNOSIS — E538 Deficiency of other specified B group vitamins: Secondary | ICD-10-CM | POA: Insufficient documentation

## 2015-12-03 ENCOUNTER — Encounter (HOSPITAL_COMMUNITY): Payer: Medicare Other

## 2015-12-03 ENCOUNTER — Ambulatory Visit: Payer: Medicare Other | Admitting: Vascular Surgery

## 2015-12-17 ENCOUNTER — Ambulatory Visit: Payer: Medicare Other | Admitting: Vascular Surgery

## 2016-02-18 ENCOUNTER — Ambulatory Visit: Payer: Medicare Other | Admitting: Vascular Surgery

## 2016-02-18 ENCOUNTER — Encounter (HOSPITAL_COMMUNITY): Payer: Medicare Other

## 2016-03-28 ENCOUNTER — Encounter: Payer: Self-pay | Admitting: Vascular Surgery

## 2016-03-30 NOTE — Progress Notes (Signed)
    Established Carotid Patient  History of Present Illness  Randall Archer is a 65 y.o. (1951/01/12) male  who presents with chief complaint: none.  This patient has known R SCA and innominate artery stenoses.  Previous carotid studies demonstrated: RICA A999333 stenosis, LICA A999333 stenosis.  Patient has no history of TIA or stroke symptom.  The patient has never had amaurosis fugax or monocular blindness.  The patient has never had facial drooping or hemiplegia.  The patient has never had receptive or expressive aphasia.  The patient denies any R arm claudication equivalent sx or vertebrobasilar sx  The patient's PMH, PSH, SH, FamHx, Med, and Allergies are unchanged from 11/27/14.  On ROS today: no arm sx, no CVA sx  Physical Examination  Vitals:   03/31/16 1440  BP: 138/80  Pulse: 67  Resp: 16  Temp: 97.1 F (36.2 C)  SpO2: 98%  Weight: 171 lb (77.6 kg)  Height: 5\' 11"  (1.803 m)   Body mass index is 23.85 kg/m.  General: A&O x 3, WD, thin  Eyes: PERRLA, EOMI  Neck: Supple, no nuchal rigidity, no palpable LAD  Pulmonary: Sym exp, good air movt, CTAB, no rales, rhonchi, & wheezing  Cardiac: RRR, Nl S1, S2, no Murmurs, rubs or gallops  Vascular: Vessel Right Left  Radial Palpable Palpable  Brachial Palpable Palpable  Carotid Palpable, with bruit (R>L) Palpable, with bruit  Aorta Not palpable N/A  Femoral Palpable Palpable  Popliteal Not palpable Not palpable  PT Palpable Palpable  DP Palpable Palpable   Gastrointestinal: soft, NTND, -G/R, - HSM, - masses, - CVAT B  Musculoskeletal: M/S 5/5 throughout , Extremities without ischemic changes , strong L RC AVF fistula thrill  Neurologic: CN grossly intact , Pain and light touch intact in extremities , Motor exam as listed above  Non-Invasive Vascular Imaging  CAROTID DUPLEX (Date: 03/31/2016 ):   R ICA stenosis: 1-39%  R VA: patent and bidirectional  L ICA stenosis: 1-39%  L VA: patent and  antegrade  R SCA: 302 c/s (297 c/s)   Medical Decision Making  Randall Archer is a 65 y.o. (19-Jul-1951) male who presents with: asx BICA stenosis <40%, asx R innominate and SCA stenoses, asx mild R subclavian steal    Pt is completely asx, so I doubt at advantage to aggressive mgmt of innominate stenosis or R SCA stenosis.  Additionally, radial pulses are symmetric, so there unlikely to be a hemodynamically significant stenosis in the right innominate and subclavian arteries.  Based on the patient's vascular studies and examination, I have offered the patient: annual B carotid duplex.  I discussed in depth with the patient the nature of atherosclerosis, and emphasized the importance of maximal medical management including strict control of blood pressure, blood glucose, and lipid levels, antiplatelet agents, obtaining regular exercise, and cessation of smoking.    The patient is aware that without maximal medical management the underlying atherosclerotic disease process will progress, limiting the benefit of any interventions.  The patient is currently on a statin: Pravachol.  The patient is currently on an anti-platelet: ASA.  Thank you for allowing [place] to participate in this patient's care.  Adele Barthel, MD [facility] Office: [phone] Pager: [phone]

## 2016-03-31 ENCOUNTER — Encounter: Payer: Self-pay | Admitting: Vascular Surgery

## 2016-03-31 ENCOUNTER — Ambulatory Visit (INDEPENDENT_AMBULATORY_CARE_PROVIDER_SITE_OTHER): Payer: Medicare Other | Admitting: Vascular Surgery

## 2016-03-31 ENCOUNTER — Ambulatory Visit (HOSPITAL_COMMUNITY)
Admission: RE | Admit: 2016-03-31 | Discharge: 2016-03-31 | Disposition: A | Discharge status: Home / Self Care | Payer: Medicare Other | Source: Ambulatory Visit | Attending: Vascular Surgery | Admitting: Vascular Surgery

## 2016-03-31 VITALS — BP 138/80 | HR 67 | Temp 97.1°F | Resp 16 | Ht 71.0 in | Wt 171.0 lb

## 2016-03-31 DIAGNOSIS — I6523 Occlusion and stenosis of bilateral carotid arteries: Secondary | ICD-10-CM

## 2016-03-31 DIAGNOSIS — I771 Stricture of artery: Secondary | ICD-10-CM | POA: Diagnosis present

## 2016-03-31 LAB — VAS US CAROTID
LCCADDIAS: 18 cm/s
LCCADSYS: 85 cm/s
LCCAPDIAS: 17 cm/s
LCCAPSYS: 86 cm/s
LEFT ECA DIAS: -12 cm/s
LICADSYS: -45 cm/s
LICAPSYS: -73 cm/s
Left ICA dist dias: -14 cm/s
Left ICA prox dias: -18 cm/s
RCCAPSYS: 121 cm/s
RIGHT CCA MID DIAS: -19 cm/s
RIGHT ECA DIAS: -12 cm/s
Right CCA prox dias: 0 cm/s
Right cca dist sys: -41 cm/s

## 2016-04-04 ENCOUNTER — Other Ambulatory Visit: Payer: Self-pay | Admitting: Vascular Surgery

## 2016-04-04 DIAGNOSIS — I771 Stricture of artery: Secondary | ICD-10-CM

## 2016-04-04 DIAGNOSIS — I6529 Occlusion and stenosis of unspecified carotid artery: Secondary | ICD-10-CM

## 2016-10-30 DIAGNOSIS — N401 Enlarged prostate with lower urinary tract symptoms: Secondary | ICD-10-CM | POA: Insufficient documentation

## 2016-10-30 DIAGNOSIS — R350 Frequency of micturition: Secondary | ICD-10-CM | POA: Insufficient documentation

## 2017-04-04 ENCOUNTER — Encounter: Payer: Self-pay | Admitting: Vascular Surgery

## 2017-04-10 NOTE — Progress Notes (Signed)
Established Intermittent Claudication   History of Present Illness   Randall Archer is a 66 y.o. (21-Apr-1951) male s/p Kidney transplant who presents with chief complaint: no complaints.  This patient has known R SCA and innominate artery stenoses. Patient's R arm sx are: none.  Patient has no sx in R arm and is able to complete all ADLs.  Previous carotid studies demonstrated: RICA <73% stenosis, LICA <41% stenosis. Patient has no history of TIA or stroke symptom. The patient has never had amaurosis fugax or monocular blindness. The patient has never had facial drooping or hemiplegia. The patient has never had receptive or expressive aphasia. The patient denies any R arm claudication equivalent sx or vertebrobasilar sx.  The patient's PMH, PSH, and SH, and FamHx are unchanged from 03/31/16.  Current Outpatient Prescriptions  Medication Sig Dispense Refill  . allopurinol (ZYLOPRIM) 100 MG tablet Take 100 mg by mouth 2 (two) times daily.    Marland Kitchen amLODipine (NORVASC) 10 MG tablet     . amLODipine (NORVASC) 5 MG tablet Take 5 mg by mouth every evening.    Marland Kitchen aspirin EC 81 MG tablet Take 81 mg by mouth daily.    Marland Kitchen BOOSTRIX 5-2.5-18.5 injection     . cephALEXin (KEFLEX) 500 MG capsule     . cinacalcet (SENSIPAR) 30 MG tablet Take 30 mg by mouth daily.    . clonazePAM (KLONOPIN) 1 MG tablet Take 1 mg by mouth every evening.    . docusate sodium (COLACE) 100 MG capsule Take 100 mg by mouth every other day.    . mycophenolate (MYFORTIC) 180 MG EC tablet Take 360 mg by mouth 2 (two) times daily.    Marland Kitchen omeprazole (PRILOSEC) 20 MG capsule Take 20 mg by mouth every evening.    . phosphorus (PHOSPHA 250 NEUTRAL) 155-852-130 MG tablet Take 2 tablets by mouth 2 (two) times daily.    . pravastatin (PRAVACHOL) 20 MG tablet 20 mg at bedtime.     . sodium bicarbonate 650 MG tablet Take 1,300 mg by mouth 2 (two) times daily.    Marland Kitchen sulfamethoxazole-trimethoprim (BACTRIM,SEPTRA) 400-80 MG per tablet Take 1  tablet by mouth every Monday, Wednesday, and Friday.    . tacrolimus (PROGRAF) 1 MG capsule Take 1 mg by mouth 2 (two) times daily. Two Tabs am and one Tab pm    . Tamsulosin HCl (FLOMAX) 0.4 MG CAPS Take 0.4 mg by mouth daily after breakfast.    . vardenafil (LEVITRA) 20 MG tablet Take 20 mg by mouth daily as needed. For E.D.     No current facility-administered medications for this visit.     On ROS today: no stroke sx, no right arm claudication sx.   Physical Examination   Vitals:   04/13/17 1608  BP: 124/69  Pulse: 61  SpO2: 100%  Weight: 177 lb 12.8 oz (80.6 kg)  Height: 5\' 11"  (1.803 m)   Body mass index is 24.8 kg/m.  General Alert, O x 3, WD, NAD  Pulmonary Sym exp, good B air movt, CTA B  Cardiac RRR, Nl S1, S2, no Murmurs, No rubs, No S3,S4  Vascular Vessel Right Left  Radial Palpable Palpable  Brachial Palpable Palpable  Carotid Palpable, No Bruit Palpable, No Bruit  Aorta Not palpable N/A  Femoral Palpable Palpable  Popliteal Not palpable Not palpable  PT Palpable Palpable  DP Palpable Palpable    Gastro- intestinal soft, non-distended, non-tender to palpation, No guarding or rebound, no HSM, no masses,  no CVAT B, No palpable prominent aortic pulse,    Musculo- skeletal M/S 5/5 throughout  , Extremities without ischemic changes  , No edema present, No visible varicosities , No Lipodermatosclerosis present  Neurologic Pain and light touch intact in extremities , Motor exam as listed above    Non-invasive Vascular Imaging     B Carotid Duplex (04/13/2017):   R ICA stenosis:  1-39%  R VA: bidirectional  L ICA stenosis:  1-39%  L VA: patent and antegrade  CCA proximal: 419 c/s (previously 121 c/s)   Medical Decision Making   Randall Archer is a 66 y.o. male who presents with:  asx BICA stenosis <40%, asx R innominate and SCA stenoses, asx mild R subclavian steal    The patient has significant jump in R proximal CCA PSV.  Unfortunately a  significant R CCA stenosis might result in artificially low PSV in R ICA, so further investigation with a CTA Neck is recommended.  Will get the CTA Neck scheduled in the next month  I discussed in depth with the patient the nature of atherosclerosis, and emphasized the importance of maximal medical management including strict control of blood pressure, blood glucose, and lipid levels, antiplatelet agents, obtaining regular exercise, and cessation of smoking.    The patient is aware that without maximal medical management the underlying atherosclerotic disease process will progress, limiting the benefit of any interventions. The patient is currently on a statin: Pravachol.  The patient is currently on an anti-platelet: ASA.  Thank you for allowing [place] to participate in this patient's care.   Adele Barthel, MD, FACS [facility] [place] [facility] Office: [phone] Pager: [phone]

## 2017-04-12 ENCOUNTER — Encounter (HOSPITAL_COMMUNITY): Payer: Self-pay | Admitting: *Deleted

## 2017-04-13 ENCOUNTER — Ambulatory Visit (HOSPITAL_COMMUNITY)
Admission: RE | Admit: 2017-04-13 | Discharge: 2017-04-13 | Disposition: A | Discharge status: Home / Self Care | Payer: Medicare Other | Source: Ambulatory Visit | Attending: Vascular Surgery | Admitting: Vascular Surgery

## 2017-04-13 ENCOUNTER — Encounter: Payer: Self-pay | Admitting: Vascular Surgery

## 2017-04-13 ENCOUNTER — Ambulatory Visit (INDEPENDENT_AMBULATORY_CARE_PROVIDER_SITE_OTHER): Payer: Medicare Other | Admitting: Vascular Surgery

## 2017-04-13 VITALS — BP 124/69 | HR 61 | Ht 71.0 in | Wt 177.8 lb

## 2017-04-13 DIAGNOSIS — I6529 Occlusion and stenosis of unspecified carotid artery: Secondary | ICD-10-CM

## 2017-04-13 DIAGNOSIS — I6523 Occlusion and stenosis of bilateral carotid arteries: Secondary | ICD-10-CM | POA: Insufficient documentation

## 2017-04-13 DIAGNOSIS — I771 Stricture of artery: Secondary | ICD-10-CM

## 2017-04-13 LAB — VAS US CAROTID
LCCADDIAS: -17 cm/s
LEFT ECA DIAS: -12 cm/s
LEFT VERTEBRAL DIAS: -11 cm/s
LICADDIAS: -19 cm/s
LICADSYS: -79 cm/s
Left CCA dist sys: -94 cm/s
Left CCA prox dias: 15 cm/s
Left CCA prox sys: 109 cm/s
Left ICA prox dias: -14 cm/s
Left ICA prox sys: -72 cm/s
RCCADSYS: -50 cm/s
RIGHT CCA MID DIAS: -21 cm/s
RIGHT ECA DIAS: -12 cm/s
RIGHT VERTEBRAL DIAS: -13 cm/s
Right CCA prox dias: 15 cm/s
Right CCA prox sys: 419 cm/s

## 2017-04-16 NOTE — Addendum Note (Signed)
Addended by: Lianne Cure A on: 04/16/2017 03:07 PM   Modules accepted: Orders

## 2017-05-09 NOTE — Progress Notes (Signed)
Established Intermittent Claudication   History of Present Illness   Randall Archer is a 66 y.o. (28-May-1951) male s/p Kidney transplant who presents with chief complaint: no complaints.  This patient has known R SCA and innominate artery stenoses. Patient's R arm sx are: none.  Patient has no sx in R arm and is able to complete all ADLs.  Previous carotid studies demonstrated: RICA <78% stenosis, LICA <93% stenosis. Patient has no history of TIA or stroke symptom. The patient has never had amaurosis fugax or monocular blindness. The patient has never had facial drooping or hemiplegia. The patient has never had receptive or expressive aphasia. The patient denies any R arm claudication equivalent sx or vertebrobasilar sx.  Pt returns from CTA Neck.    The patient's PMH, PSH, and SH, and FamHx are unchanged from 04/13/17.  Current Outpatient Prescriptions  Medication Sig Dispense Refill  . allopurinol (ZYLOPRIM) 100 MG tablet Take 100 mg by mouth 2 (two) times daily.    Marland Kitchen amLODipine (NORVASC) 5 MG tablet Take 5 mg by mouth every evening.    Marland Kitchen aspirin EC 81 MG tablet Take 81 mg by mouth daily.    . cinacalcet (SENSIPAR) 30 MG tablet Take 30 mg by mouth daily.    . clonazePAM (KLONOPIN) 1 MG tablet Take 1 mg by mouth every evening.    . docusate sodium (COLACE) 100 MG capsule Take 100 mg by mouth every other day.    . mycophenolate (MYFORTIC) 180 MG EC tablet Take 360 mg by mouth 2 (two) times daily.    Marland Kitchen omeprazole (PRILOSEC) 20 MG capsule Take 20 mg by mouth every evening.    . phosphorus (PHOSPHA 250 NEUTRAL) 155-852-130 MG tablet Take 2 tablets by mouth 2 (two) times daily.    . pravastatin (PRAVACHOL) 20 MG tablet 20 mg at bedtime.     . sodium bicarbonate 650 MG tablet Take 1,300 mg by mouth 2 (two) times daily.    Marland Kitchen sulfamethoxazole-trimethoprim (BACTRIM,SEPTRA) 400-80 MG per tablet Take 1 tablet by mouth every Monday, Wednesday, and Friday.    . tacrolimus (PROGRAF) 1  MG capsule Take 1 mg by mouth 2 (two) times daily. Two Tabs am and one Tab pm    . Tamsulosin HCl (FLOMAX) 0.4 MG CAPS Take 0.4 mg by mouth daily after breakfast.    . vardenafil (LEVITRA) 20 MG tablet Take 20 mg by mouth daily as needed. For E.D.     No current facility-administered medications for this visit.    On ROS today: no CVA or TIA sx, no R arm sx   Physical Examination   Vitals:   05/11/17 1507  BP: 137/73  Pulse: 62  Resp: 16  SpO2: 98%  Weight: 175 lb (79.4 kg)  Height: 5\' 11"  (1.803 m)   Body mass index is 24.41 kg/m.   General Alert, O x 3, WD, NAD  Pulmonary Sym exp, good B air movt, CTA B  Cardiac RRR, Nl S1, S2, no Murmurs, No rubs, No S3,S4  Vascular Vessel Right Left  Radial Palpable Palpable  Brachial Palpable Palpable  Carotid Palpable, No Bruit Palpable, No Bruit  Aorta Not palpable N/A  Femoral Palpable Palpable  Popliteal Not palpable Not palpable  PT Palpable Palpable  DP Palpable Palpable    Gastro- intestinal soft, non-distended, non-tender to palpation, No guarding or rebound, no HSM, no masses, no CVAT B, No palpable prominent aortic pulse,    Musculo- skeletal M/S 5/5 throughout  ,  Extremities without ischemic changes  , No edema present, No visible varicosities , No Lipodermatosclerosis present  Neurologic Pain and light touch intact in extremities , Motor exam as listed above    Radiology     Ct Angio Neck W Or Wo Contrast  Result Date: 05/11/2017 CLINICAL DATA:  Continued surveillance innominate stenosis diagnosed in 2014. EXAM: CT ANGIOGRAPHY NECK TECHNIQUE: Multidetector CT imaging of the neck was performed using the standard protocol during bolus administration of intravenous contrast. Multiplanar CT image reconstructions and MIPs were obtained to evaluate the vascular anatomy. Carotid stenosis measurements (when applicable) are obtained utilizing NASCET criteria, using the distal internal carotid diameter as the denominator.  CONTRAST:  Isovue 370, 50 mL. COMPARISON:  None. FINDINGS: Aortic arch: Transverse arch calcification. There is a focal calcification resulting in luminal narrowing at the origin of the innominate artery as noted previously. Measurement along the same dimension as 2014 yields a luminal diameter of 5.2 mm, unchanged. No soft plaque or dissection. There may be minimal poststenotic dilatation in the distal innominate. Non stenotic atheromatous change can be seen at the origin of the RIGHT subclavian, RIGHT common carotid, LEFT common carotid, and LEFT subclavian. In addition, the LEFT vertebral originates directly from the arch and does not show significant plaque. Right carotid system: Calcific atheromatous change at the bifurcation. No evidence of dissection, stenosis (50% or greater) or occlusion. Left carotid system: Calcific atheromatous change at the bifurcation. No evidence of dissection, stenosis (50% or greater) or occlusion. Vertebral arteries: LEFT dominant. Calcified atheromatous change at the origin RIGHT vertebral, roughly 50%, stable. Skeleton: Spondylosis, most pronounced at C5-6 and C6-7. Dentition is absent. Other neck: No neck mass. Lung apices clear. Negative visualized intracranial compartment. Upper chest: No lung apex lesion is evident. IMPRESSION: Stable atherosclerotic changes of the transverse arch, great vessel origins, and carotid bifurcations. No significant progression of disease, with specific attention to the innominate origin, when compared 2014. Electronically Signed   By: Staci Righter M.D.   On: 05/11/2017 14:59   I agree with the CTA reading, pt has relatively focal innominate artery stenosis with residual 5 mm lumen.  Both carotid arteries have some limited calcific plaque but both lumens have limited disease.  Right SCA lumen also appears to be patent.   Medical Decision Making   Randall Archer is a 66 y.o. (1951-02-19) male who presents with:  asx BICA stenosis <40%,  asx R innominate and SCA stenoses, asx mild R subclavian steal    Based on the CTA, I am reassured of the status of the R SCA, R innominate, and R carotid artery.  I would repeat the B carotid duplex in 6 months.  If stable, pt would like to consider moving to annual surveillance.  I discussed in depth with the patient the nature of atherosclerosis, and emphasized the importance of maximal medical management including strict control of blood pressure, blood glucose, and lipid levels, antiplatelet agents, obtaining regular exercise, and cessation of smoking.    The patient is aware that without maximal medical management the underlying atherosclerotic disease process will progress, limiting the benefit of any interventions.  The patient is currently on a statin: Pravachol.   The patient is currently on an anti-platelet: ASA.  Thank you for allowing [place] to participate in this patient's care.   Adele Barthel, MD, FACS [facility] Office: [phone] Pager: [phone]

## 2017-05-10 DIAGNOSIS — R809 Proteinuria, unspecified: Secondary | ICD-10-CM | POA: Insufficient documentation

## 2017-05-10 HISTORY — PX: RENAL BIOPSY: SHX156

## 2017-05-11 ENCOUNTER — Encounter: Payer: Self-pay | Admitting: Vascular Surgery

## 2017-05-11 ENCOUNTER — Ambulatory Visit (INDEPENDENT_AMBULATORY_CARE_PROVIDER_SITE_OTHER): Payer: Medicare Other | Admitting: Vascular Surgery

## 2017-05-11 ENCOUNTER — Ambulatory Visit
Admission: RE | Admit: 2017-05-11 | Discharge: 2017-05-11 | Disposition: A | Discharge status: Home / Self Care | Payer: Medicare Other | Source: Ambulatory Visit | Attending: Vascular Surgery | Admitting: Vascular Surgery

## 2017-05-11 VITALS — BP 137/73 | HR 62 | Resp 16 | Ht 71.0 in | Wt 175.0 lb

## 2017-05-11 DIAGNOSIS — I6523 Occlusion and stenosis of bilateral carotid arteries: Secondary | ICD-10-CM | POA: Diagnosis not present

## 2017-05-11 DIAGNOSIS — I771 Stricture of artery: Secondary | ICD-10-CM

## 2017-05-11 MED ORDER — IOPAMIDOL (ISOVUE-370) INJECTION 76%
75.0000 mL | Freq: Once | INTRAVENOUS | Status: AC | PRN
  Administered 2017-05-11: 50 mL via INTRAVENOUS

## 2017-05-18 NOTE — Addendum Note (Signed)
Addended by: Lianne Cure A on: 05/18/2017 09:54 AM   Modules accepted: Orders

## 2017-06-25 DIAGNOSIS — R0989 Other specified symptoms and signs involving the circulatory and respiratory systems: Secondary | ICD-10-CM | POA: Insufficient documentation

## 2017-11-12 NOTE — Progress Notes (Signed)
Established Carotid Patient   History of Present Illness   Randall Archer is a 67 y.o. (10-22-1950) male s/p kidney transplant who presents with chief complaint: no issues.  Patient has known R SCA and innominate artery stenoses.  Patient R arm sx are: none.  Pt is able to complete all ADLs without any right arm sx. The patient denies any R arm claudication equivalent sx or vertebrobasilar sx.  Previous carotid studies demonstrated: RICA <35% stenosis, LICA <59% stenosis on CTA Neck (05/11/17).  Patient has no history of TIA or stroke symptom.  The patient has never had amaurosis fugax or monocular blindness.  The patient has never had facial drooping or hemiplegia.  The patient has never had receptive or expressive aphasia.    The patient's PMH, PSH, SH, and FamHx were reviewed on 11/14/17 are unchanged from 05/11/17.  Current Outpatient Medications  Medication Sig Dispense Refill  . allopurinol (ZYLOPRIM) 100 MG tablet Take 100 mg by mouth 2 (two) times daily.    Marland Kitchen amLODipine (NORVASC) 5 MG tablet Take 5 mg by mouth every evening.    Marland Kitchen aspirin EC 81 MG tablet Take 81 mg by mouth daily.    . cinacalcet (SENSIPAR) 30 MG tablet Take 30 mg by mouth daily.    . clonazePAM (KLONOPIN) 1 MG tablet Take 1 mg by mouth every evening.    . cyanocobalamin 1000 MCG tablet Take 1,000 mcg by mouth daily.    . mycophenolate (MYFORTIC) 180 MG EC tablet Take 360 mg by mouth 2 (two) times daily.    Marland Kitchen omeprazole (PRILOSEC) 20 MG capsule Take 20 mg by mouth every evening.    . phosphorus (PHOSPHA 250 NEUTRAL) 155-852-130 MG tablet Take 2 tablets by mouth 2 (two) times daily.    . pravastatin (PRAVACHOL) 20 MG tablet 20 mg at bedtime.     . sodium bicarbonate 650 MG tablet Take 1,300 mg by mouth 2 (two) times daily.    Marland Kitchen sulfamethoxazole-trimethoprim (BACTRIM,SEPTRA) 400-80 MG per tablet Take 1 tablet by mouth every Monday, Wednesday, and Friday.    . tacrolimus (PROGRAF) 1 MG capsule Take 1 mg by  mouth 2 (two) times daily. Two Tabs am and one Tab pm    . Tamsulosin HCl (FLOMAX) 0.4 MG CAPS Take 0.4 mg by mouth daily after breakfast.     No current facility-administered medications for this visit.     On ROS today: no intermittent claudication , still not ESRD   Physical Examination   Vitals:   11/14/17 1035 11/14/17 1041  BP: (!) 153/74 (!) 157/71  Pulse: (!) 54 (!) 54  Resp: 16   Temp: (!) 97 F (36.1 C)   TempSrc: Oral   SpO2: 99%   Weight: 167 lb (75.8 kg)   Height: 5\' 11"  (1.803 m)    Body mass index is 23.29 kg/m.  General Alert, O x 3, WD, NAD  Neck Supple, mid-line trachea,    Pulmonary Sym exp, good B air movt, CTA B  Cardiac RRR, Nl S1, S2, no Murmurs, No rubs, No S3,S4  Vascular Vessel Right Left  Radial Palpable Palpable  Brachial Palpable Palpable  Carotid Palpable, No Bruit Palpable, No Bruit  Aorta Not palpable N/A  Femoral Palpable Palpable  Popliteal Not palpable Not palpable  PT Faintly palpable Faintly palpable  DP Faintly palpable Faintly palpable    Gastro- intestinal soft, non-distended, non-tender to palpation, No guarding or rebound, no HSM, no masses, no CVAT B, No palpable  prominent aortic pulse,    Musculo- skeletal M/S 5/5 throughout  , Extremities without ischemic changes, L RC AVF with bruit and thrill    Neurologic Cranial nerves 2-12 intact , Pain and light touch intact in extremities , Motor exam as listed above    Non-Invasive Vascular Imaging   B Carotid Duplex (11/14/2017):   R ICA stenosis:  1-39% (79/20 c/s)  Prior ICA 62/23 c/s, CCA 95/21 c/s  R VA: patent and bidrectional  L ICA stenosis:  1-39% (62/18 /cs)  Prior ICA 79/19 c/s, CCA 109/17 c/s  L VA: patent and antegrade   Medical Decision Making   HAJI DELAINE is a 67 y.o. male who presents with: asx B ICA stenosis 1-39%, known innominate and R CCA stenoses   No evidence of compromised R CCA inflow based on B carotid duplex.  Based on the  patient's vascular studies and examination, I have offered the patient: annual B carotid duplex.  I discussed in depth with the patient the nature of atherosclerosis, and emphasized the importance of maximal medical management including strict control of blood pressure, blood glucose, and lipid levels, antiplatelet agents, obtaining regular exercise, and cessation of smoking.    The patient is aware that without maximal medical management the underlying atherosclerotic disease process will progress, limiting the benefit of any interventions.  The patient is currently on a statin: Pravachol.   The patient is currently on an anti-platelet: ASA.  Thank you for allowing [place] to participate in this patient's care.   Adele Barthel, MD, FACS [facility] [place] Office: [phone] Pager: [phone]

## 2017-11-14 ENCOUNTER — Ambulatory Visit (INDEPENDENT_AMBULATORY_CARE_PROVIDER_SITE_OTHER): Payer: Medicare Other | Admitting: Vascular Surgery

## 2017-11-14 ENCOUNTER — Encounter: Payer: Self-pay | Admitting: Vascular Surgery

## 2017-11-14 ENCOUNTER — Ambulatory Visit (HOSPITAL_COMMUNITY)
Admission: RE | Admit: 2017-11-14 | Discharge: 2017-11-14 | Disposition: A | Discharge status: Home / Self Care | Payer: Medicare Other | Source: Ambulatory Visit | Attending: Vascular Surgery | Admitting: Vascular Surgery

## 2017-11-14 ENCOUNTER — Other Ambulatory Visit: Payer: Self-pay

## 2017-11-14 VITALS — BP 157/71 | HR 54 | Temp 97.0°F | Resp 16 | Ht 71.0 in | Wt 167.0 lb

## 2017-11-14 DIAGNOSIS — Z87891 Personal history of nicotine dependence: Secondary | ICD-10-CM | POA: Diagnosis not present

## 2017-11-14 DIAGNOSIS — I771 Stricture of artery: Secondary | ICD-10-CM | POA: Diagnosis not present

## 2017-11-14 DIAGNOSIS — I6523 Occlusion and stenosis of bilateral carotid arteries: Secondary | ICD-10-CM

## 2017-11-27 DIAGNOSIS — Z7689 Persons encountering health services in other specified circumstances: Secondary | ICD-10-CM | POA: Insufficient documentation

## 2017-12-05 DIAGNOSIS — T8691 Unspecified transplanted organ and tissue rejection: Secondary | ICD-10-CM | POA: Insufficient documentation

## 2018-03-12 DIAGNOSIS — T8611 Kidney transplant rejection: Secondary | ICD-10-CM | POA: Insufficient documentation

## 2018-06-26 DIAGNOSIS — D631 Anemia in chronic kidney disease: Secondary | ICD-10-CM | POA: Insufficient documentation

## 2018-06-26 DIAGNOSIS — N189 Chronic kidney disease, unspecified: Secondary | ICD-10-CM | POA: Insufficient documentation

## 2018-08-05 DIAGNOSIS — I739 Peripheral vascular disease, unspecified: Secondary | ICD-10-CM | POA: Insufficient documentation

## 2018-08-13 ENCOUNTER — Other Ambulatory Visit: Payer: Self-pay | Admitting: Nephrology

## 2018-08-13 DIAGNOSIS — Z940 Kidney transplant status: Secondary | ICD-10-CM

## 2018-08-15 ENCOUNTER — Ambulatory Visit
Admission: RE | Admit: 2018-08-15 | Discharge: 2018-08-15 | Disposition: A | Discharge status: Home / Self Care | Payer: Medicare Other | Source: Ambulatory Visit | Attending: Nephrology | Admitting: Nephrology

## 2018-08-15 DIAGNOSIS — Z940 Kidney transplant status: Secondary | ICD-10-CM

## 2018-09-02 DIAGNOSIS — R0600 Dyspnea, unspecified: Secondary | ICD-10-CM | POA: Insufficient documentation

## 2018-09-02 DIAGNOSIS — Z87440 Personal history of urinary (tract) infections: Secondary | ICD-10-CM | POA: Insufficient documentation

## 2018-09-02 DIAGNOSIS — N400 Enlarged prostate without lower urinary tract symptoms: Secondary | ICD-10-CM | POA: Insufficient documentation

## 2018-09-02 DIAGNOSIS — R52 Pain, unspecified: Secondary | ICD-10-CM | POA: Insufficient documentation

## 2018-09-02 DIAGNOSIS — R7302 Impaired glucose tolerance (oral): Secondary | ICD-10-CM | POA: Insufficient documentation

## 2018-09-02 DIAGNOSIS — R0989 Other specified symptoms and signs involving the circulatory and respiratory systems: Secondary | ICD-10-CM | POA: Insufficient documentation

## 2018-09-02 DIAGNOSIS — L299 Pruritus, unspecified: Secondary | ICD-10-CM | POA: Insufficient documentation

## 2018-09-02 DIAGNOSIS — N2581 Secondary hyperparathyroidism of renal origin: Secondary | ICD-10-CM | POA: Insufficient documentation

## 2018-09-02 DIAGNOSIS — E785 Hyperlipidemia, unspecified: Secondary | ICD-10-CM | POA: Insufficient documentation

## 2018-09-13 DIAGNOSIS — D509 Iron deficiency anemia, unspecified: Secondary | ICD-10-CM | POA: Insufficient documentation

## 2018-10-10 DIAGNOSIS — E876 Hypokalemia: Secondary | ICD-10-CM | POA: Insufficient documentation

## 2018-11-07 DIAGNOSIS — D689 Coagulation defect, unspecified: Secondary | ICD-10-CM | POA: Insufficient documentation

## 2019-05-29 ENCOUNTER — Other Ambulatory Visit: Payer: Self-pay

## 2019-05-29 DIAGNOSIS — I6523 Occlusion and stenosis of bilateral carotid arteries: Secondary | ICD-10-CM

## 2019-06-02 ENCOUNTER — Ambulatory Visit (INDEPENDENT_AMBULATORY_CARE_PROVIDER_SITE_OTHER): Payer: Medicare Other | Admitting: Family

## 2019-06-02 ENCOUNTER — Ambulatory Visit (HOSPITAL_COMMUNITY)
Admission: RE | Admit: 2019-06-02 | Discharge: 2019-06-02 | Disposition: A | Discharge status: Home / Self Care | Payer: Medicare Other | Source: Ambulatory Visit | Attending: Family | Admitting: Family

## 2019-06-02 ENCOUNTER — Other Ambulatory Visit: Payer: Self-pay

## 2019-06-02 ENCOUNTER — Encounter: Payer: Self-pay | Admitting: Family

## 2019-06-02 VITALS — BP 138/77 | HR 67 | Temp 97.2°F | Resp 18 | Ht 71.0 in | Wt 156.0 lb

## 2019-06-02 DIAGNOSIS — I771 Stricture of artery: Secondary | ICD-10-CM | POA: Diagnosis not present

## 2019-06-02 DIAGNOSIS — Z940 Kidney transplant status: Secondary | ICD-10-CM | POA: Diagnosis not present

## 2019-06-02 DIAGNOSIS — I6523 Occlusion and stenosis of bilateral carotid arteries: Secondary | ICD-10-CM

## 2019-06-02 NOTE — Progress Notes (Signed)
Chief Complaint: Follow up Extracranial Carotid Artery Stenosis   History of Present Illness  Randall Archer is a 68 y.o. male who is s/p kidney transplant whom Dr. Bridgett Larsson had been monitoring for a  known R SCA and innominate artery stenoses.  Patient R arm sx are: none.  Pt is able to complete all ADLs without any right arm sx. The patient denies any R arm claudication equivalent sx or vertebrobasilar sx.  He started hemodialysis in January 2020.  He has an AVF in his left forearm. He denies tingling or numbness in either hand. He denies any problems during dialysis.  He denies any bleeding issues from his left forearm AVF.  He dialyzes TTS, Dr. Justin Mend is his nephrologist. Pt is right hand dominant.  He is back on the transplant list.   Dr. Bridgett Larsson last evaluated pt on 11-14-17. At that time pt had asymptomatic B ICA stenosis 1-39%, known innominate and R CCA stenoses. No evidence of compromised R CCA inflow based on B carotid duplex. Dr. Bridgett Larsson advised annual B carotid duplex.  Previous carotid studies demonstrated: RICA <16% stenosis, LICA <10% stenosis on CTA Neck (05/11/17).  Patient has no history of TIA or stroke symptom.  The patient has never had amaurosis fugax or monocular blindness.  The patient has never had facial drooping or hemiplegia.  The patient has never had receptive or expressive aphasia.   He builds houses, is a Chief Strategy Officer.   Diabetic: yes, states his last A1C was 6.? Tobacco use: current smoker, takes a month to go through a pack  Pt meds include: Statin : yes ASA: no, states his GI stopped it due to occult blood in stool Other anticoagulants/antiplatelets: no   Past Medical History:  Diagnosis Date  . 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 Carotid artery occlusion   . Hx of kidney transplant   . Hypertension     Social History Social History   Tobacco Use  . Smoking status: Former Smoker    Types: Cigarettes    Quit date: 09/13/2008    Years  since quitting: 10.7  . Smokeless tobacco: Never Used  Substance Use Topics  . Alcohol use: No  . Drug use: No    Family History Family History  Problem Relation Age of Onset  . Hypertension Mother   . Hypertension Father   . Cancer Father   . Heart attack Father   . Heart disease Brother        Heart Disease before age 39  . Heart attack Brother   . Hypertension Brother   . Hypertension Sister     Surgical History Past Surgical History:  Procedure Laterality Date  . AV FISTULA PLACEMENT    . KIDNEY TRANSPLANT  12/2010  . RENAL BIOPSY N/A 05/10/2017  . ROTATOR CUFF REPAIR Left     No Known Allergies  Current Outpatient Medications  Medication Sig Dispense Refill  . allopurinol (ZYLOPRIM) 100 MG tablet Take 100 mg by mouth 2 (two) times daily.    Marland Kitchen amLODipine (NORVASC) 5 MG tablet Take 5 mg by mouth every evening.    Marland Kitchen aspirin EC 81 MG tablet Take 81 mg by mouth daily.    . cinacalcet (SENSIPAR) 30 MG tablet Take 30 mg by mouth daily.    . clonazePAM (KLONOPIN) 1 MG tablet Take 1 mg by mouth every evening.    . cyanocobalamin 1000 MCG tablet Take 1,000 mcg by mouth daily.    Marland Kitchen  mycophenolate (MYFORTIC) 180 MG EC tablet Take 360 mg by mouth 2 (two) times daily.    Marland Kitchen omeprazole (PRILOSEC) 20 MG capsule Take 20 mg by mouth every evening.    . phosphorus (PHOSPHA 250 NEUTRAL) 155-852-130 MG tablet Take 2 tablets by mouth 2 (two) times daily.    . pravastatin (PRAVACHOL) 20 MG tablet 20 mg at bedtime.     . sodium bicarbonate 650 MG tablet Take 1,300 mg by mouth 2 (two) times daily.    Marland Kitchen sulfamethoxazole-trimethoprim (BACTRIM,SEPTRA) 400-80 MG per tablet Take 1 tablet by mouth every Monday, Wednesday, and Friday.    . tacrolimus (PROGRAF) 1 MG capsule Take 1 mg by mouth 2 (two) times daily. Two Tabs am and one Tab pm    . Tamsulosin HCl (FLOMAX) 0.4 MG CAPS Take 0.4 mg by mouth daily after breakfast.     No current facility-administered medications for this visit.      Review of Systems : See HPI for pertinent positives and negatives.  Physical Examination  Vitals:   06/02/19 1031  BP: 138/77  Pulse: 67  Resp: 18  Temp: (!) 97.2 F (36.2 C)  TempSrc: Temporal  SpO2: 99%  Weight: 156 lb (70.8 kg)  Height: 5\' 11"  (1.308 m)   Body mass index is 21.76 kg/m.  General: WDWN male in NAD accompanied by his wife GAIT: normal Eyes: Pupils are equal and round HENT: No gross abnormalities.  Pulmonary:  Respirations are non-labored, good air movement in all fields, CTAB, no rales, rhonchi, or wheezes Cardiac: regular rhythm, + low grade murmur.  VASCULAR EXAM Carotid Bruits Right Left   Positive Positive     Abdominal aortic pulse is not palpable. Radial pulses are 2+ palpable and equal.   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 Musculoskeletal: no muscle atrophy/wasting. M/S 5/5 throughout, extremities without ischemic changes Skin: No rashes, no ulcers, no cellulitis.   Neurologic:  A&O X 3; appropriate affect, sensation is normal; speech is normal, CN 2-12 intact, pain and light touch intact in extremities, motor exam as listed above. Psychiatric: Normal thought content, mood appropriate to clinical situation.    DATA  Carotid Duplex (06-02-19): Right Carotid Findings: +----------+--------+--------+--------+--------------------+-------------------+           PSV cm/sEDV cm/sStenosisPlaque Description  Comments             +----------+--------+--------+--------+--------------------+-------------------+  CCA Prox  565     0       >50%                        Velocities suggest                                                        proximal stenosis   +----------+--------+--------+--------+--------------------+-------------------+ CCA Mid   74      21              heterogenous and                                                          calcific                                +----------+--------+--------+--------+--------------------+-------------------+ CCA Distal51      16              irregular and                                                             calcific                                +----------+--------+--------+--------+--------------------+-------------------+ ICA Prox  53      10      1-39%   smooth and                                                                heterogenous                            +----------+--------+--------+--------+--------------------+-------------------+ ICA Mid   56      19                                                      +----------+--------+--------+--------+--------------------+-------------------+ ICA Distal46      18                                                      +----------+--------+--------+--------+--------------------+-------------------+ ECA       86      11                                                      +----------+--------+--------+--------+--------------------+-------------------+  +----------+--------+-------+--------+-------------------+  PSV cm/sEDV cmsDescribeArm Pressure (mmHG) +----------+--------+-------+--------+-------------------+ Subclavian89             Stenotic                    +----------+--------+-------+--------+-------------------+  +---------+--------+--+--------+-+----------+ VertebralPSV cm/s68EDV  cm/s0Retrograde +---------+--------+--+--------+-+----------+  Early systolic deceleration in the subclavian artery.  Left Carotid Findings: +----------+--------+--------+--------+--------------------------+--------+           PSV cm/sEDV cm/sStenosisPlaque Description        Comments +----------+--------+--------+--------+--------------------------+--------+ CCA Prox  121     0                                                  +----------+--------+--------+--------+--------------------------+--------+ CCA Mid   109     18                                                 +----------+--------+--------+--------+--------------------------+--------+ CCA Distal100     20              irregular and calcific             +----------+--------+--------+--------+--------------------------+--------+ ICA Prox  86      19      1-39%   heterogenous and irregular         +----------+--------+--------+--------+--------------------------+--------+ ICA Mid   83      19                                                 +----------+--------+--------+--------+--------------------------+--------+ ICA Distal83      16                                                 +----------+--------+--------+--------+--------------------------+--------+ ECA       170     12                                                 +----------+--------+--------+--------+--------------------------+--------+  +----------+--------+--------+----------------+-------------------+           PSV cm/sEDV cm/sDescribe        Arm Pressure (mmHG) +----------+--------+--------+----------------+-------------------+ Subclavian266     57      Multiphasic, WNL                    +----------+--------+--------+----------------+-------------------+  +---------+--------+--+--------+--+---------+ VertebralPSV cm/s88EDV  cm/s10Antegrade +---------+--------+--+--------+--+---------+  Summary: Right Carotid: Velocities in the right ICA are consistent with a 1-39% stenosis.                Non-hemodynamically significant plaque <50% noted in the CCA.                Doppler waveforms, velocities and retrograde vertebral right                vertebral artery are consistent with innominate artery stenosis. Left Carotid: Velocities in the  left ICA are consistent with a 1-39% stenosis. Vertebrals:  Left vertebral artery demonstrates antegrade flow. Right vertebral              artery demonstrates retrograde flow. Subclavians: Right subclavian artery was stenotic. Normal flow hemodynamics were              seen in the left subclavian artery.   Assessment: Randall Archer is a 68 y.o. 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  Carotid duplex today shows 1-39% ICA stenosis. Doppler waveforms, velocities and retrograde vertebral right vertebral artery are consistent with innominate artery stenosis. Right subclavian artery was stenotic. Normal flow hemodynamics were seen in the left subclavian artery. He has a left forearm AVF, this is used for hemodialysis.    Plan: Follow-up in 1 year with Carotid Duplex scan.   I discussed in depth with the patient the nature of atherosclerosis, and emphasized the importance of maximal medical management including strict control of blood pressure, blood glucose, and lipid levels, obtaining regular exercise, and cessation of smoking.  The patient is aware that without maximal medical management the underlying atherosclerotic disease process will progress, limiting the benefit of any interventions. The patient was given information about stroke prevention and what symptoms should prompt the patient to seek immediate medical care. Thank you for allowing [place] to participate in this patient's care.  Clemon Chambers, RN, MSN, FNP-C [facility] [place] Office: [phone]  Clinic Physician: Trula Slade  06/02/19 11:08 AM

## 2019-09-24 DIAGNOSIS — N186 End stage renal disease: Secondary | ICD-10-CM | POA: Insufficient documentation

## 2019-10-01 DIAGNOSIS — Z8616 Personal history of COVID-19: Secondary | ICD-10-CM | POA: Insufficient documentation

## 2019-10-16 DIAGNOSIS — T7840XA Allergy, unspecified, initial encounter: Secondary | ICD-10-CM | POA: Insufficient documentation

## 2020-07-09 ENCOUNTER — Other Ambulatory Visit: Payer: Self-pay

## 2020-07-09 DIAGNOSIS — I6523 Occlusion and stenosis of bilateral carotid arteries: Secondary | ICD-10-CM

## 2020-07-19 ENCOUNTER — Encounter: Payer: Self-pay | Admitting: Physician Assistant

## 2020-07-19 ENCOUNTER — Ambulatory Visit (HOSPITAL_COMMUNITY)
Admission: RE | Admit: 2020-07-19 | Discharge: 2020-07-19 | Disposition: A | Discharge status: Home / Self Care | Payer: Medicare Other | Source: Ambulatory Visit | Attending: Physician Assistant | Admitting: Physician Assistant

## 2020-07-19 ENCOUNTER — Other Ambulatory Visit: Payer: Self-pay

## 2020-07-19 ENCOUNTER — Ambulatory Visit (INDEPENDENT_AMBULATORY_CARE_PROVIDER_SITE_OTHER): Payer: Medicare Other | Admitting: Physician Assistant

## 2020-07-19 VITALS — BP 131/77 | HR 72 | Temp 98.4°F | Resp 20 | Ht 71.0 in | Wt 156.0 lb

## 2020-07-19 DIAGNOSIS — I6523 Occlusion and stenosis of bilateral carotid arteries: Secondary | ICD-10-CM | POA: Diagnosis not present

## 2020-07-19 DIAGNOSIS — I771 Stricture of artery: Secondary | ICD-10-CM

## 2020-07-19 NOTE — Progress Notes (Signed)
Office Note     CC:  follow up Requesting Provider:  Raina Mina., MD  HPI: Randall Archer is a 69 y.o. (05-Mar-1951) male who presents for routine follow up of right subclavian and innominate artery stenosis. He also follows up for minimal bilateral carotid stenosis. He has not had any upper extremity symptoms. He is right hand dominant so he uses his right arm a lot and he denies any cramping, numbness, weakness or coldness. He denies any amaurosis fugax or monocular blindness, slurred speech, facial drooping, weakness or numbness of upper or lower extremities. 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 He has a left forearm AVF. He has history of kidney transplant that failed approximately 2 years ago. He currently is not having any issues with his LUE AVF. He states he is on list for another transplant  The pt is on a statin for cholesterol management.  The pt not on a daily aspirin.   Other AC: none The pt is on CCB, diuretic, for hypertension.   The pt is diabetic.  Tobacco hx:  Current  Past Medical History:  Diagnosis Date  . 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 Carotid artery occlusion   . Hx of kidney transplant   . Hypertension     Past Surgical History:  Procedure Laterality Date  . AV FISTULA PLACEMENT    . KIDNEY TRANSPLANT  12/2010  . RENAL BIOPSY N/A 05/10/2017  . ROTATOR CUFF REPAIR Left     Social History   Socioeconomic History  . Marital status: Married    Spouse name: Not on file  . Number of children: Not on file  . Years of education: Not on file  . Highest education level: Not on file  Occupational History  . Not on file  Tobacco Use  . Smoking status: Former Smoker    Types: Cigarettes    Quit date: 09/13/2008    Years since quitting: 11.8  . Smokeless tobacco: Never Used  Vaping Use  . Vaping Use: Never used  Substance and Sexual Activity  . Alcohol use: No  . Drug use: No  .  Sexual activity: Not on file  Other Topics Concern  . Not on file  Social History Narrative  . Not on file   Social Determinants of Health   Financial Resource Strain: Not on file  Food Insecurity: Not on file  Transportation Needs: Not on file  Physical Activity: Not on file  Stress: Not on file  Social Connections: Not on file  Intimate Partner Violence: Not on file    Family History  Problem Relation Age of Onset  . Hypertension Mother   . Hypertension Father   . Cancer Father   . Heart attack Father   . Heart disease Brother        Heart Disease before age 17  . Heart attack Brother   . Hypertension Brother   . Hypertension Sister     Current Outpatient Medications  Medication Sig Dispense Refill  . allopurinol (ZYLOPRIM) 100 MG tablet Take 100 mg by mouth 2 (two) times daily.    Marland Kitchen amLODipine (NORVASC) 5 MG tablet Take 5 mg by mouth every evening.    . B Complex-C-Zn-Folic Acid (DIALYVITE 502 WITH ZINC) 0.8 MG TABS Take 1 tablet by mouth daily.    . cinacalcet (SENSIPAR) 30 MG tablet Take 30 mg by mouth daily.    Marland Kitchen  clonazePAM (KLONOPIN) 1 MG tablet Take 1 mg by mouth every evening.    . cyanocobalamin 1000 MCG tablet Take 1,000 mcg by mouth daily.    . ferric citrate (AURYXIA) 1 GM 210 MG(Fe) tablet Take by mouth.    . furosemide (LASIX) 80 MG tablet Take by mouth.    . heparin 1000 unit/mL SOLN injection Heparin Sodium (Porcine) 1,000 Units/mL Systemic    . insulin glargine (LANTUS) 100 UNIT/ML injection Inject into the skin.    Marland Kitchen lidocaine (LINDAMANTLE) 3 % CREA cream Apply topically.    . melatonin 5 MG TABS Take by mouth.    . mycophenolate (MYFORTIC) 180 MG EC tablet Take 360 mg by mouth 2 (two) times daily.    Marland Kitchen omeprazole (PRILOSEC) 20 MG capsule Take 20 mg by mouth every evening.    . phosphorus (K PHOS NEUTRAL) 155-852-130 MG tablet Take 2 tablets by mouth 2 (two) times daily.    . pravastatin (PRAVACHOL) 20 MG tablet 20 mg at bedtime.     . sodium  bicarbonate 650 MG tablet Take 1,300 mg by mouth 2 (two) times daily.    . tacrolimus (PROGRAF) 1 MG capsule Take 1 mg by mouth 2 (two) times daily. Two Tabs am and one Tab pm    . Tamsulosin HCl (FLOMAX) 0.4 MG CAPS Take 0.4 mg by mouth daily after breakfast.    . VITAMIN D PO Take by mouth.    . sulfamethoxazole-trimethoprim (BACTRIM,SEPTRA) 400-80 MG per tablet Take 1 tablet by mouth every Monday, Wednesday, and Friday. (Patient not taking: Reported on 07/19/2020)     No current facility-administered medications for this visit.    No Known Allergies   REVIEW OF SYSTEMS:  [X]  denotes positive finding, [ ]  denotes negative finding Cardiac  Comments:  Chest pain or chest pressure:    Shortness of breath upon exertion:    Short of breath when lying flat:    Irregular heart rhythm:        Vascular    Pain in calf, thigh, or hip brought on by ambulation:    Pain in feet at night that wakes you up from your sleep:     Blood clot in your veins:    Leg swelling:         Pulmonary    Oxygen at home:    Productive cough:     Wheezing:         Neurologic    Sudden weakness in arms or legs:     Sudden numbness in arms or legs:     Sudden onset of difficulty speaking or slurred speech:    Temporary loss of vision in one eye:     Problems with dizziness:         Gastrointestinal    Blood in stool:     Vomited blood:         Genitourinary    Burning when urinating:     Blood in urine:        Psychiatric    Major depression:         Hematologic    Bleeding problems:    Problems with blood clotting too easily:        Skin    Rashes or ulcers:        Constitutional    Fever or chills:      PHYSICAL EXAMINATION:  Vitals:   07/19/20 0941  BP: 131/77  Pulse: 72  Resp: 20  Temp: 98.4  F (36.9 C)  TempSrc: Temporal  SpO2: 97%  Weight: 156 lb (70.8 kg)  Height: 5\' 11"  (1.803 m)    General:  WDWN in NAD; vital signs documented above Gait: Normal HENT: WNL,  normocephalic Pulmonary: normal non-labored breathing , without wheezing Cardiac: regular HR, without  Murmurs without carotid bruit, has bilateral subclavian bruits Abdomen: soft, NT, no masses Vascular Exam/Pulses:  Right Left  Radial 2+ (normal) 2+ (normal)  Femoral 2+ (normal) 2+ (normal)  Popliteal Not palpable Not palpable  DP 2+ (normal) 2+ (normal)  PT 2+ (normal) 2+ (normal)   Extremities: without ischemic changes, without Gangrene , without cellulitis; without open wounds;  Musculoskeletal: no muscle wasting or atrophy  Neurologic: A&O X 3;  No focal weakness or paresthesias are detected Psychiatric:  The pt has Normal affect.   Non-Invasive Vascular Imaging:   Right Carotid: Velocities in the right ICA are consistent with a 1-39% stenosis.   Left Carotid: Velocities in the left ICA are consistent with a 1-39% stenosis.   Vertebrals: Right vertebral artery demonstrates retrograde flow. Left vertebral artery demonstrates antegrade flow. Subclavians: Right subclavian artery was stenotic (PSV 329 cm/s). Left subclavian with atypical waveforms (PSV 262 cm/s)    ASSESSMENT/PLAN:: 69 y.o. male here for follow up for right subclavian and innominate artery stenosis. He also follows up for minimal bilateral carotid stenosis. He remains asymptomatic. Duplex shows stable subclavian stenosis on RUE. She continues to have retrograde stenosis in vertebral consistent with innominate stenosis. Bilateral carotid arteries on duplex are consistent with 1-39% stenosis. - He will follow up sooner if he develops new or concerning symptoms -He will follow up in 1 year with Carotid duplex   Karoline Caldwell, PA-C [facility] [phone]  On call MD:  Dr. Oneida Alar

## 2020-11-18 IMAGING — US US RENAL TRANSPLANT
1 series · 13 of 25 positions shown · non-contrast
Comparison: None

CLINICAL DATA: Renal transplant in 2882, rising creatinine

EXAM:
ULTRASOUND OF RENAL TRANSPLANT WITH RENAL DOPPLER ULTRASOUND
TECHNIQUE: Ultrasound examination of the renal transplant was performed with
gray-scale, color and duplex doppler evaluation.

[Series 1: us renal transplant · 0.22mm/px · 13 of 45 slices shown]
[im 1/45]
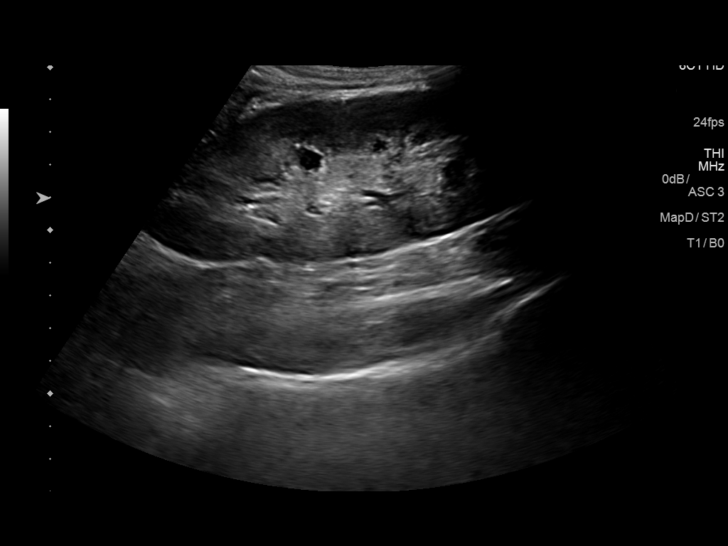
[im 4/45]
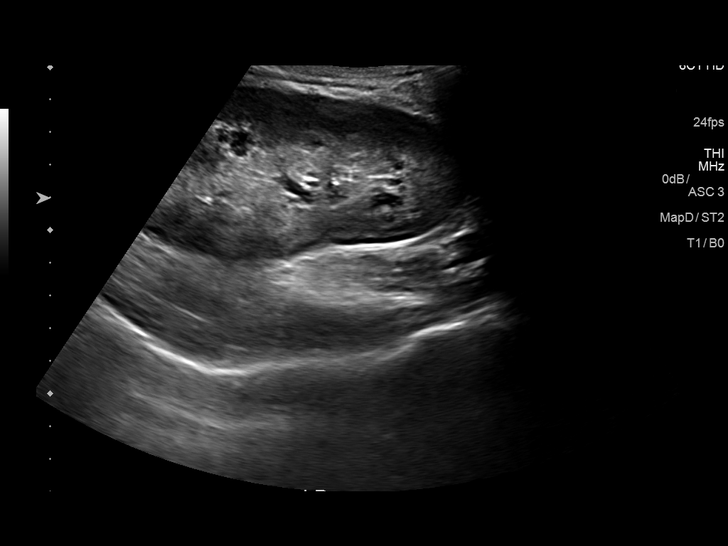
[im 8/45]
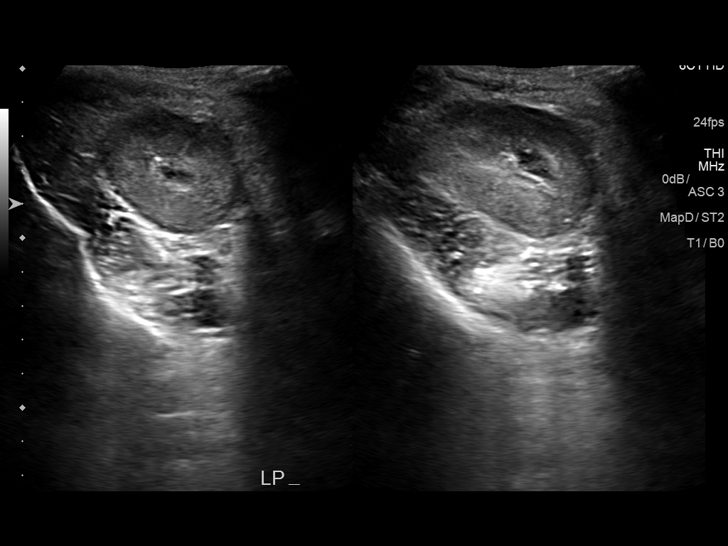
[im 12/45]
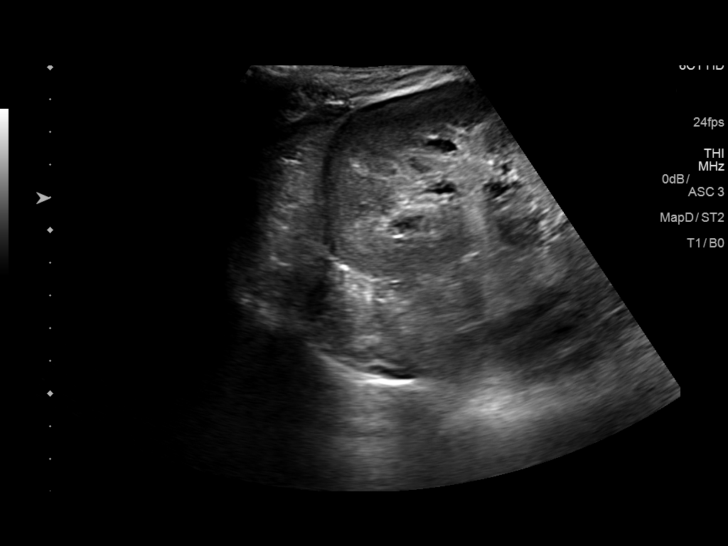
[im 15/45]
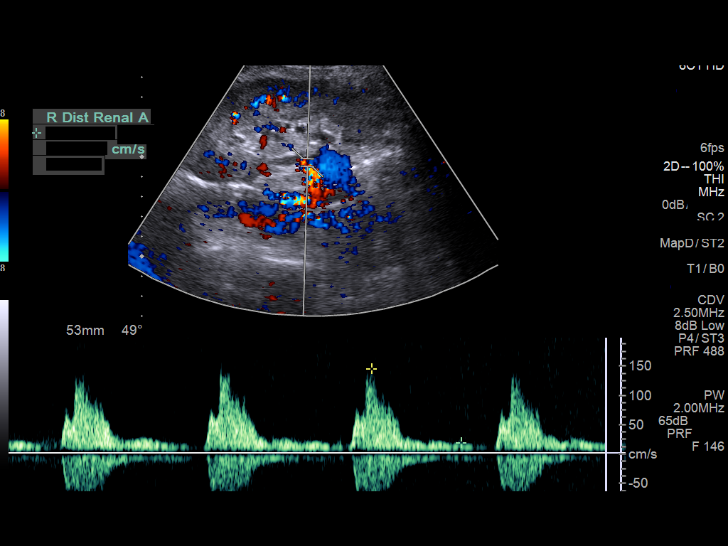
[im 19/45]
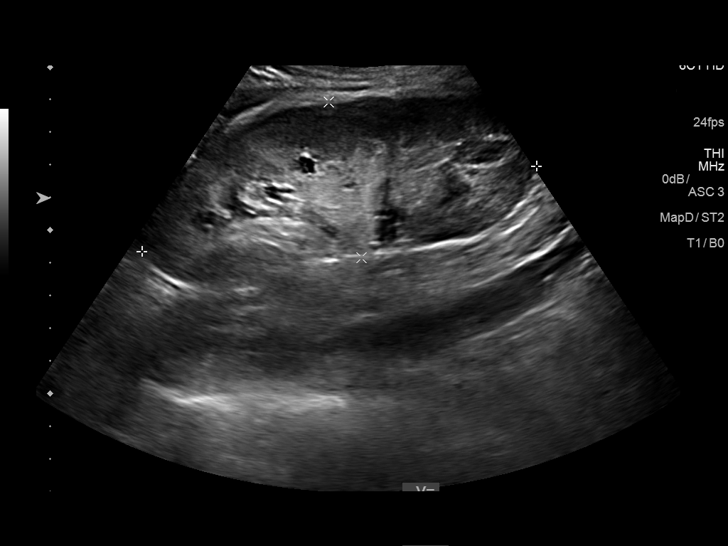
[im 23/45]
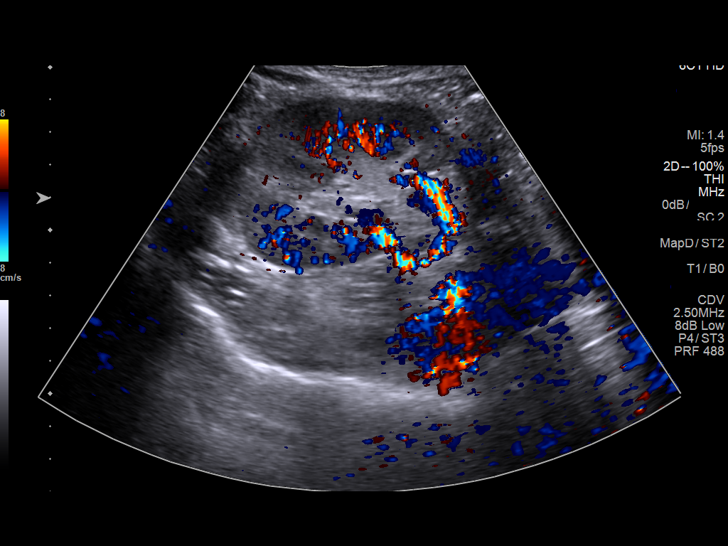
[im 26/45]
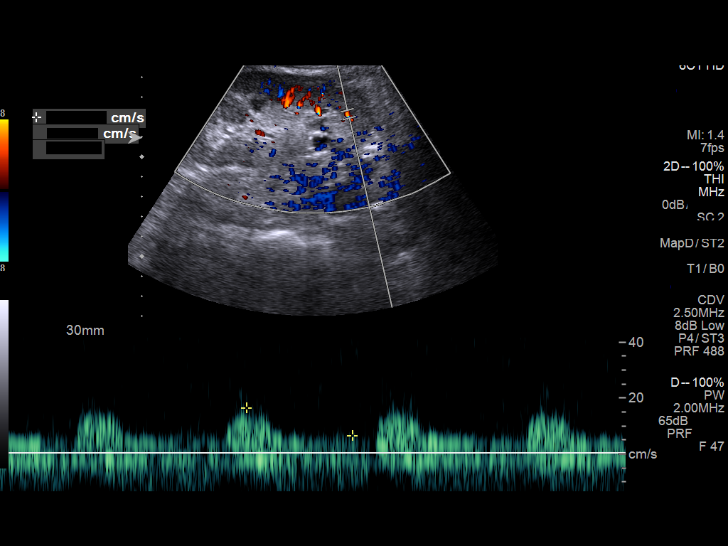
[im 30/45]
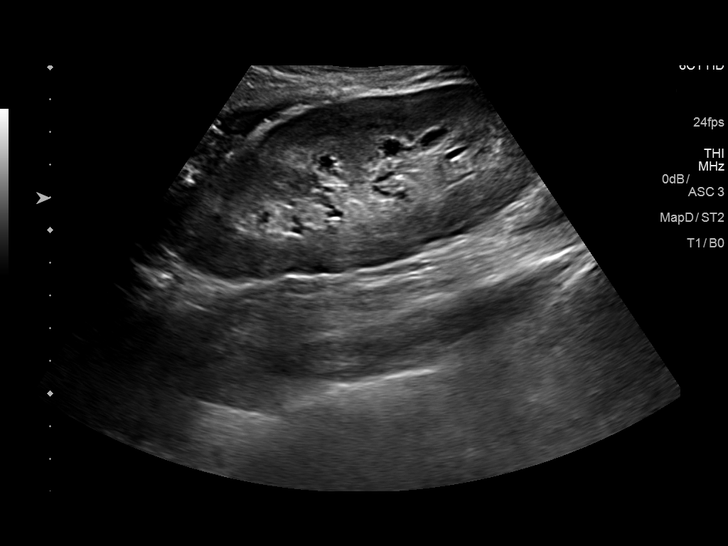
[im 34/45]
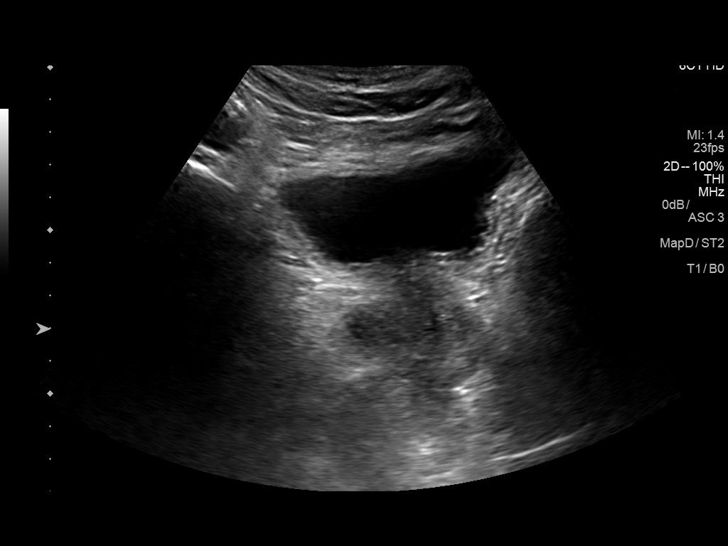
[im 37/45]
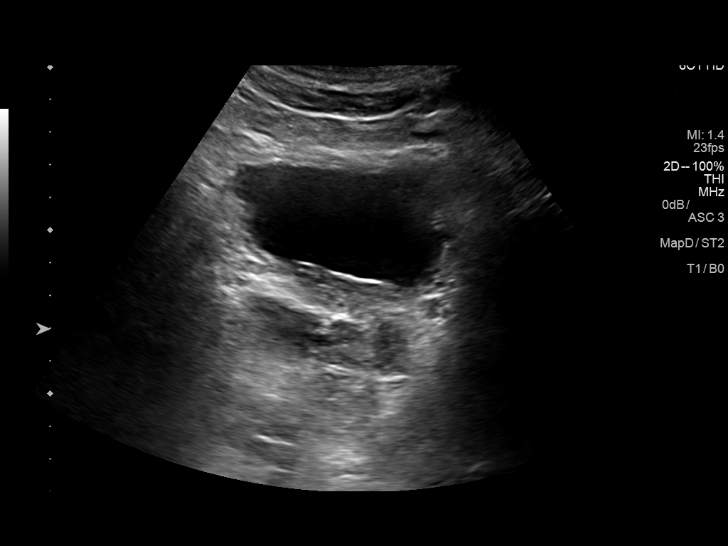
[im 41/45]
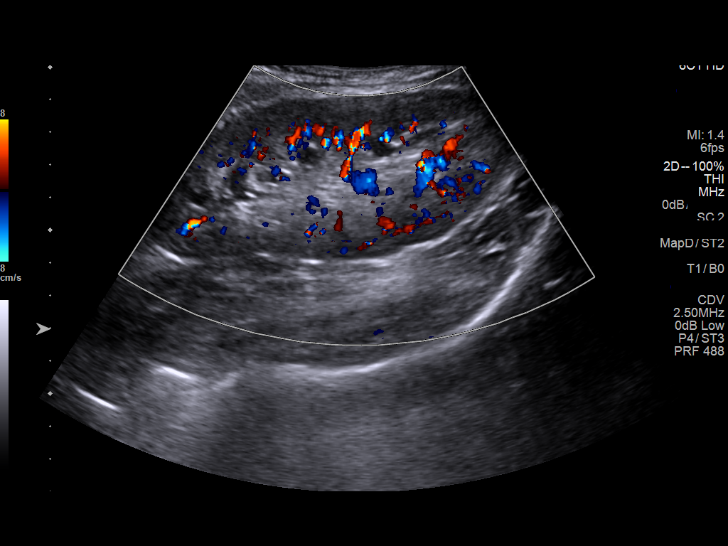
[im 45/45]
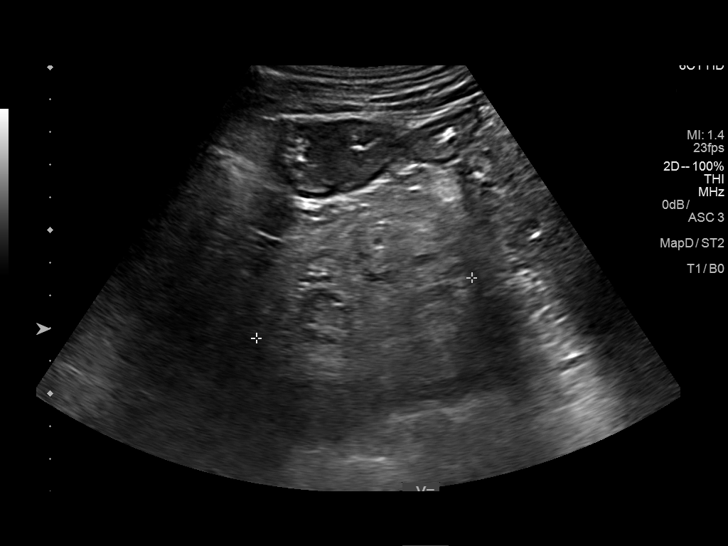

[13 of 25 positions shown; findings below may reference images not displayed]

FINDINGS: Transplant kidney location: RIGHT iliac fossa

Transplant Kidney:

Renal measurements: 12.4 x 4.9 x 6.3 cm = volume: 198mL. Normal
cortical thickness and echogenicity. No mass or hydronephrosis. No
shadowing calcifications..

Color flow in the main renal artery:  Present

Color flow in the main renal vein:  Present

Duplex Doppler Evaluation:

Main Renal Artery Resistive Index:

Venous waveform in main renal vein:  Present

Intrarenal resistive index in upper pole:

(normal 0.6-0.8; equivocal 0.8-0.9; abnormal >= 0.9)

Intrarenal resistive index at mid kidney:

(normal 0.6-0.8; equivocal 0.8-0.9; abnormal >= 0.9)

Intrarenal resistive index in lower pole:

(normal 0.6-0.8; equivocal 0.8-0.9; abnormal >= 0.9)

Bladder: Normal appearance

Other findings: Prostate gland measures 3.6 x 3.1 x 4.2 cm = 24.4 mL
IMPRESSION: Normal appearing transplant kidney without mass or hydronephrosis.

Normal intrarenal resistive indices.

## 2021-11-05 DEATH — deceased

## 2024-10-05 DEATH — deceased
